# Patient Record
Sex: Female | Born: 2000 | Race: White | Marital: Single | Smoking: Never smoker
Health system: Southern US, Community
[De-identification: ages and names within clinical notes are randomized; demographics above are authoritative.]

## PROBLEM LIST (undated history)

## (undated) DIAGNOSIS — H548 Legal blindness, as defined in USA: Secondary | ICD-10-CM

## (undated) DIAGNOSIS — G40909 Epilepsy, unspecified, not intractable, without status epilepticus: Secondary | ICD-10-CM

## (undated) DIAGNOSIS — F840 Autistic disorder: Secondary | ICD-10-CM

## (undated) DIAGNOSIS — R413 Other amnesia: Secondary | ICD-10-CM

## (undated) DIAGNOSIS — F819 Developmental disorder of scholastic skills, unspecified: Secondary | ICD-10-CM

## (undated) DIAGNOSIS — R87629 Unspecified abnormal cytological findings in specimens from vagina: Secondary | ICD-10-CM

## (undated) DIAGNOSIS — F88 Other disorders of psychological development: Secondary | ICD-10-CM

## (undated) DIAGNOSIS — F32A Depression, unspecified: Secondary | ICD-10-CM

## (undated) DIAGNOSIS — F909 Attention-deficit hyperactivity disorder, unspecified type: Secondary | ICD-10-CM

## (undated) DIAGNOSIS — G939 Disorder of brain, unspecified: Secondary | ICD-10-CM

## (undated) DIAGNOSIS — G43909 Migraine, unspecified, not intractable, without status migrainosus: Secondary | ICD-10-CM

## (undated) DIAGNOSIS — F329 Major depressive disorder, single episode, unspecified: Secondary | ICD-10-CM

## (undated) DIAGNOSIS — H479 Unspecified disorder of visual pathways: Secondary | ICD-10-CM

## (undated) HISTORY — DX: Disorder of brain, unspecified: G93.9

## (undated) HISTORY — DX: Developmental disorder of scholastic skills, unspecified: F81.9

## (undated) HISTORY — DX: Autistic disorder: F84.0

## (undated) HISTORY — DX: Depression, unspecified: F32.A

## (undated) HISTORY — DX: Migraine, unspecified, not intractable, without status migrainosus: G43.909

## (undated) HISTORY — DX: Unspecified disorder of visual pathways: H47.9

## (undated) HISTORY — DX: Attention-deficit hyperactivity disorder, unspecified type: F90.9

## (undated) HISTORY — PX: WISDOM TOOTH EXTRACTION: SHX21

## (undated) HISTORY — DX: Epilepsy, unspecified, not intractable, without status epilepticus: G40.909

## (undated) HISTORY — DX: Other disorders of psychological development: F88

## (undated) HISTORY — DX: Unspecified abnormal cytological findings in specimens from vagina: R87.629

## (undated) HISTORY — DX: Legal blindness, as defined in USA: H54.8

## (undated) HISTORY — PX: MOUTH SURGERY: SHX715

## (undated) HISTORY — DX: Other amnesia: R41.3

---
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
## 2020-01-09 ENCOUNTER — Other Ambulatory Visit: Payer: Self-pay

## 2020-01-09 ENCOUNTER — Ambulatory Visit (INDEPENDENT_AMBULATORY_CARE_PROVIDER_SITE_OTHER): Payer: BC Managed Care – PPO | Admitting: Family Medicine

## 2020-01-09 ENCOUNTER — Encounter: Payer: Self-pay | Admitting: Family Medicine

## 2020-01-09 VITALS — BP 126/79 | HR 97 | Temp 99.3°F | Ht 65.0 in | Wt 190.0 lb

## 2020-01-09 DIAGNOSIS — G939 Disorder of brain, unspecified: Secondary | ICD-10-CM | POA: Diagnosis not present

## 2020-01-09 DIAGNOSIS — Z30011 Encounter for initial prescription of contraceptive pills: Secondary | ICD-10-CM

## 2020-01-09 DIAGNOSIS — F819 Developmental disorder of scholastic skills, unspecified: Secondary | ICD-10-CM

## 2020-01-09 DIAGNOSIS — G43909 Migraine, unspecified, not intractable, without status migrainosus: Secondary | ICD-10-CM

## 2020-01-09 DIAGNOSIS — K219 Gastro-esophageal reflux disease without esophagitis: Secondary | ICD-10-CM

## 2020-01-09 DIAGNOSIS — G40909 Epilepsy, unspecified, not intractable, without status epilepticus: Secondary | ICD-10-CM | POA: Diagnosis not present

## 2020-01-09 DIAGNOSIS — F88 Other disorders of psychological development: Secondary | ICD-10-CM

## 2020-01-09 DIAGNOSIS — H479 Unspecified disorder of visual pathways: Secondary | ICD-10-CM

## 2020-01-09 DIAGNOSIS — R413 Other amnesia: Secondary | ICD-10-CM

## 2020-01-09 LAB — PREGNANCY, URINE: Preg Test, Ur: NEGATIVE

## 2020-01-09 MED ORDER — NORETHINDRONE ACET-ETHINYL EST 1-20 MG-MCG PO TABS: ORAL_TABLET | ORAL | 3 refills | Status: DC

## 2020-01-09 MED ORDER — CETIRIZINE HCL 10 MG PO TABS: 10.0000 mg | ORAL_TABLET | Freq: Every day | ORAL | 1 refills | Status: DC

## 2020-01-09 MED ORDER — FAMOTIDINE 20 MG PO TABS: 20.0000 mg | ORAL_TABLET | Freq: Two times a day (BID) | ORAL | 2 refills | Status: DC

## 2020-01-09 NOTE — Progress Notes (Signed)
New Patient Office Visit  Assessment & Plan:  1. Encounter for prescription of oral contraceptives - Pregnancy, urine (negative) - norethindrone-ethinyl estradiol (LOESTRIN 1/20, 21,) 1-20 MG-MCG tablet; Take 1 tablet by mouth daily x3 weeks, then stop x1 week, then start new pack.  Dispense: 3 Package; Refill: 3  2. Gastroesophageal reflux disease, unspecified whether esophagitis present - Uncontrolled. New prescription. Education provided on food choices for GERD.  - famotidine (PEPCID) 20 MG tablet; Take 1 tablet (20 mg total) by mouth 2 (two) times daily.  Dispense: 60 tablet; Refill: 2  3-8. Brain damage/Nonintractable epilepsy without status epilepticus, unspecified epilepsy type (HCC)/Learning disabilities/Migraine without status migrainosus, not intractable, unspecified migraine type/Sensory processing difficulty/Short-term memory loss - Ambulatory referral to Neurology  9. Cortical visual impairment - Handicap placard form completed.     Follow-up: Return for annual physical.   Claire Boston, MSN, APRN, FNP-C [facility]  Subjective:  Patient ID: Claire Stewart. [place], female    DOB: 2001-03-12  Age: 19 y.o. MRN: [id number]  Patient Care Team: Gwenlyn Fudge, FNP as PCP - General (Family Medicine)  CC:  Chief Complaint  Patient presents with  . New Patient (Initial Visit)    [name]   . Establish Care  . Abdominal Pain    Patient states that it has been going on since August   . Referral    Neuro    HPI Claire Stewart presents to establish care. She is transferring from the [facility] and [facility].   Patient is accompanied by her mother.  Patient is in need of a referral to a neurologist in the area.  She prefers a female and would like to go to [place].  She has been to [facility] in the past but that was when she was only 19 year of age and does not wish to go all the way to [place] if she does  not have to.  Patient is in need of a refill of her birth control that she takes for hormone control due to seizures.  She has been out of her medication for almost 2 weeks now.  Patient reports her stomach has been hurting for the past 8 months.  She states it is worse when she is lying flat and at night.  Mom reports she "eats TUMS like candy".  Patient did try Bentyl in the past but it was not effective.  She has regular bowel movements 2-3 times a day with no pain or straining.   Review of Systems  Constitutional: Negative for chills, fever, malaise/fatigue and weight loss.  HENT: Positive for ear pain. Negative for congestion, ear discharge, nosebleeds, sinus pain, sore throat and tinnitus.   Eyes: Positive for blurred vision. Negative for double vision, pain, discharge and redness.  Respiratory: Negative for cough, shortness of breath and wheezing.   Cardiovascular: Negative for chest pain, palpitations and leg swelling.  Gastrointestinal: Positive for abdominal pain and heartburn. Negative for constipation, diarrhea, nausea and vomiting.  Genitourinary: Negative for dysuria, frequency and urgency.  Musculoskeletal: Negative for myalgias.  Skin: Negative for rash.  Neurological: Positive for seizures and headaches. Negative for dizziness and weakness.  Psychiatric/Behavioral: Positive for memory loss. Negative for depression, substance abuse and suicidal ideas. The patient is not nervous/anxious.     Current Outpatient Medications:  .  cetirizine (ZYRTEC) 10 MG tablet, Take 1 tablet (10 mg total) by mouth daily., Disp: 90 tablet, Rfl: 1 .  Levetiracetam 750  MG TB24, Take 3,000 mg by mouth daily. Take 4 tablets at night, Disp: , Rfl:  .  magnesium gluconate (MAGONATE) 500 MG tablet, Take 500 mg by mouth daily., Disp: , Rfl:  .  meloxicam (MOBIC) 7.5 MG tablet, Take 7.5 mg by mouth as needed for pain., Disp: , Rfl:  .  OVER THE COUNTER MEDICATION, Gummy multi vit, Disp: , Rfl:  .  OVER  THE COUNTER MEDICATION, Gummy digestive advantage, Disp: , Rfl:  .  PRESCRIPTION MEDICATION, Dialstat, Disp: , Rfl:  .  prochlorperazine (COMPAZINE) 10 MG tablet, Take 10 mg by mouth every 6 (six) hours as needed for nausea or vomiting., Disp: , Rfl:  .  SUMAtriptan (IMITREX) 20 MG/ACT nasal spray, Place 20 mg into the nose every 2 (two) hours as needed for migraine or headache. May repeat in 2 hours if headache persists or recurs., Disp: , Rfl:  .  topiramate (TOPAMAX) 50 MG tablet, Take 150 mg by mouth daily. 50mg  AM- 100mg  PM, Disp: , Rfl:  .  famotidine (PEPCID) 20 MG tablet, Take 1 tablet (20 mg total) by mouth 2 (two) times daily., Disp: 60 tablet, Rfl: 2 .  norethindrone-ethinyl estradiol (LOESTRIN 1/20, 21,) 1-20 MG-MCG tablet, Take 1 tablet by mouth daily x3 weeks, then stop x1 week, then start new pack., Disp: 3 Package, Rfl: 3  Allergies  Allergen Reactions  . Sulfa Antibiotics Hives  . Levetiracetam Other (See Comments)    Can not have brand name- - caused nerve pain     Past Medical History:  Diagnosis Date  . ADHD   . Brain damage   . Cortical visual impairment   . Depression   . Epilepsia (HCC)   . Migraines   . Short-term memory loss     Past Surgical History:  Procedure Laterality Date  . WISDOM TOOTH EXTRACTION      Family History  Problem Relation Age of Onset  . Hyperlipidemia Mother   . Allergies Mother   . ADD / ADHD Mother   . Depression Mother   . Arthritis Mother   . Alcohol abuse Father   . Drug abuse Father   . Depression Father   . Allergies Brother   . ADD / ADHD Brother   . Drug abuse Maternal Grandmother   . Diabetes Maternal Grandmother   . Bipolar disorder Maternal Grandmother   . Hypertension Maternal Grandfather   . Hyperlipidemia Maternal Grandfather   . Depression Maternal Grandfather   . Arthritis Maternal Grandfather     Social History   Socioeconomic History  . Marital status: Single    Spouse name: Not on file   . Number of children: Not on file  . Years of education: Not on file  . Highest education level: Not on file  Occupational History  . Not on file  Tobacco Use  . Smoking status: Never Smoker  . Smokeless tobacco: Never Used  Substance and Sexual Activity  . Alcohol use: Never  . Drug use: Never  . Sexual activity: Not Currently  Other Topics Concern  . Not on file  Social History Narrative  . Not on file   Social Determinants of Health   Financial Resource Strain:   . Difficulty of Paying Living Expenses:   Food Insecurity:   . Worried About 2/20 in the Last Year:   . [employer] in the Last Year:   Transportation Needs:   . Programme researcher, broadcasting/film/video (Medical):   Barista  Lack of Transportation (Non-Medical):   Physical Activity:   . Days of Exercise per Week:   . Minutes of Exercise per Session:   Stress:   . Feeling of Stress :   Social Connections:   . Frequency of Communication with Friends and Family:   . Frequency of Social Gatherings with Friends and Family:   . Attends Religious Services:   . Active Member of Clubs or Organizations:   . Attends Archivist Meetings:   Marland Kitchen Marital Status:   Intimate Partner Violence:   . Fear of Current or Ex-Partner:   . Emotionally Abused:   Marland Kitchen Physically Abused:   . Sexually Abused:     Objective:   Today's Vitals: BP 126/79   Pulse 97   Temp 99.3 F (37.4 C) (Temporal)   Ht 5\' 5"  (1.651 m)   Wt 190 lb (86.2 kg)   LMP 12/24/2019 (Approximate)   SpO2 96%   BMI 31.62 kg/m   Physical Exam Vitals reviewed.  Constitutional:      General: She is not in acute distress.    Appearance: Normal appearance. She is obese. She is not ill-appearing, toxic-appearing or diaphoretic.  HENT:     Head: Normocephalic and atraumatic.     Right Ear: Tympanic membrane, ear canal and external ear normal. There is no impacted cerumen.     Left Ear: Tympanic membrane, ear canal and external ear normal. There is no  impacted cerumen.  Eyes:     General: No scleral icterus.       Right eye: No discharge.        Left eye: No discharge.     Conjunctiva/sclera: Conjunctivae normal.  Cardiovascular:     Rate and Rhythm: Normal rate and regular rhythm.     Heart sounds: Normal heart sounds. No murmur. No friction rub. No gallop.   Pulmonary:     Effort: Pulmonary effort is normal. No respiratory distress.     Breath sounds: Normal breath sounds. No stridor. No wheezing, rhonchi or rales.  Musculoskeletal:        General: Normal range of motion.     Cervical back: Normal range of motion.  Skin:    General: Skin is warm and dry.     Capillary Refill: Capillary refill takes less than 2 seconds.  Neurological:     General: No focal deficit present.     Mental Status: She is alert and oriented to person, place, and time. Mental status is at baseline.  Psychiatric:        Mood and Affect: Mood normal.        Behavior: Behavior normal.        Thought Content: Thought content normal.        Judgment: Judgment normal.

## 2020-01-09 NOTE — Patient Instructions (Signed)

## 2020-01-13 ENCOUNTER — Encounter: Payer: Self-pay | Admitting: Family Medicine

## 2020-01-13 DIAGNOSIS — R413 Other amnesia: Secondary | ICD-10-CM | POA: Insufficient documentation

## 2020-01-13 DIAGNOSIS — F88 Other disorders of psychological development: Secondary | ICD-10-CM | POA: Insufficient documentation

## 2020-01-13 DIAGNOSIS — H479 Unspecified disorder of visual pathways: Secondary | ICD-10-CM | POA: Insufficient documentation

## 2020-01-13 DIAGNOSIS — G43909 Migraine, unspecified, not intractable, without status migrainosus: Secondary | ICD-10-CM | POA: Insufficient documentation

## 2020-01-13 DIAGNOSIS — G40909 Epilepsy, unspecified, not intractable, without status epilepticus: Secondary | ICD-10-CM | POA: Insufficient documentation

## 2020-01-13 DIAGNOSIS — G939 Disorder of brain, unspecified: Secondary | ICD-10-CM | POA: Insufficient documentation

## 2020-01-13 DIAGNOSIS — F819 Developmental disorder of scholastic skills, unspecified: Secondary | ICD-10-CM | POA: Insufficient documentation

## 2020-02-05 ENCOUNTER — Encounter: Payer: Self-pay | Admitting: Family Medicine

## 2020-02-11 ENCOUNTER — Other Ambulatory Visit: Payer: Self-pay | Admitting: Family Medicine

## 2020-02-27 ENCOUNTER — Other Ambulatory Visit: Payer: Self-pay

## 2020-02-27 ENCOUNTER — Encounter: Payer: Self-pay | Admitting: Neurology

## 2020-02-27 ENCOUNTER — Ambulatory Visit (INDEPENDENT_AMBULATORY_CARE_PROVIDER_SITE_OTHER): Payer: BC Managed Care – PPO | Admitting: Neurology

## 2020-02-27 VITALS — BP 125/78 | HR 88 | Ht 65.0 in | Wt 190.0 lb

## 2020-02-27 DIAGNOSIS — G40301 Generalized idiopathic epilepsy and epileptic syndromes, not intractable, with status epilepticus: Secondary | ICD-10-CM

## 2020-02-27 DIAGNOSIS — IMO0002 Reserved for concepts with insufficient information to code with codable children: Secondary | ICD-10-CM | POA: Insufficient documentation

## 2020-02-27 DIAGNOSIS — F809 Developmental disorder of speech and language, unspecified: Secondary | ICD-10-CM | POA: Insufficient documentation

## 2020-02-27 DIAGNOSIS — R6250 Unspecified lack of expected normal physiological development in childhood: Secondary | ICD-10-CM

## 2020-02-27 DIAGNOSIS — G43709 Chronic migraine without aura, not intractable, without status migrainosus: Secondary | ICD-10-CM | POA: Diagnosis not present

## 2020-02-27 MED ORDER — LEVETIRACETAM ER 750 MG PO TB24: 3000.0000 mg | ORAL_TABLET | Freq: Every day | ORAL | 4 refills | Status: DC

## 2020-02-27 MED ORDER — RIZATRIPTAN BENZOATE 10 MG PO TBDP
10.0000 mg | ORAL_TABLET | ORAL | 6 refills | Status: DC | PRN
Start: 2020-02-27 — End: 2020-08-31

## 2020-02-27 MED ORDER — TOPIRAMATE 100 MG PO TABS: 100.0000 mg | ORAL_TABLET | Freq: Two times a day (BID) | ORAL | 4 refills | Status: DC

## 2020-02-27 NOTE — Progress Notes (Addendum)
PATIENT: Claire Stewart DOB: [birth year]  Chief Complaint  Patient presents with  . Migraine    She moved here from to [place] in September 2020. She is here with her mother, [name]. She estimates having 1-3 migraines each month. Weather changes play a big role in her headaches. She had Mobic, Compazine and Imitrex to take in combination for her severe headaches. Her mother also states she has a six day steroid dose pack on file at the pharmacy to take if the home meds failed.   . Seizures    Reports her last seizure occurred on 05/12/2015.  Claire Stewart Learning Disabilities    No new changes in memory. Reports short term memory issues and learning disabilites since childhood.   Claire Stewart PCP    Gwenlyn Fudge, FNP     HISTORICAL  Claire Stewart is a 19 year old female, seen in request by her primary care nurse practitioner [name] evaluation of seizure, learning disability, migraine headache, she is accompanied by her mother at today's clinical visit on February 27, 2020  I reviewed and summarized the referring note. She moved from [place] to [place] recently, was previously under the care of neurologist for seizure, migraine headaches  She was born full-term, but suffered jaundice, at 57 days old, she suffered prolonged seizure, later noted developmental delay, was found to have significant visual impairment, she has been homeschooled,  She was treated with antiepileptic medication as an infant following her initial prolonged seizure, but medicine was tapered off,  By 19 years old, she was noted to have frequent staring off spells, eventually was diagnosed with partial seizure, has been treated with Keppra titrating dose, currently taking extended release 750 mg 4 tablets at nighttime, works well for her, last seizure was on May 12, 2015, she had a sudden onset of staring spells,  She has a long history of migraine headaches, now having 1-3 times each month, taking Topamax 50/100  mg as preventive medication, Imitrex works most of the time, but sometimes her headache is so severe, with significant light noise sensitivity, Imitrex would not work for those prolonged severe migraine headaches  She is legally blind,can read large print only,  REVIEW OF SYSTEMS: Full 14 system review of systems performed and notable only for as above All other review of systems were negative.  ALLERGIES: Allergies  Allergen Reactions  . Sulfa Antibiotics Hives  . Levetiracetam Other (See Comments)    Can not have brand name- [employer]- caused nerve pain     HOME MEDICATIONS: Current Outpatient Medications  Medication Sig Dispense Refill  . cetirizine (ZYRTEC) 10 MG tablet Take 1 tablet (10 mg total) by mouth daily. 90 tablet 1  . famotidine (PEPCID) 20 MG tablet Take 1 tablet (20 mg total) by mouth 2 (two) times daily. 60 tablet 2  . Levetiracetam 750 MG TB24 Take 3,000 mg by mouth daily. Take 4 tablets at night    . magnesium gluconate (MAGONATE) 500 MG tablet Take 500 mg by mouth daily.    . meloxicam (MOBIC) 7.5 MG tablet TAKE 1 TABLET (7.5 MG TOTAL) BY MOUTH AS NEEDED FOR MODERATE PAIN (LIMIT ONCE DAILY TWICE WEEKLY.). 90 tablet 1  . norethindrone-ethinyl estradiol (LOESTRIN 1/20, 21,) 1-20 MG-MCG tablet Take 1 tablet by mouth daily x3 weeks, then stop x1 week, then start new pack. 3 Package 3  . OVER THE COUNTER MEDICATION Gummy multi vit    . OVER THE COUNTER MEDICATION Gummy digestive advantage    .  PRESCRIPTION MEDICATION Dialstat    . prochlorperazine (COMPAZINE) 10 MG tablet TAKE 1 TABLET BY MOUTH 2 TIMES A DAY AS NEEDED FOR NAUSEA. 30 tablet 2  . SUMAtriptan (IMITREX) 20 MG/ACT nasal spray Place 20 mg into the nose every 2 (two) hours as needed for migraine or headache. May repeat in 2 hours if headache persists or recurs.    . topiramate (TOPAMAX) 50 MG tablet Take 150 mg by mouth daily. 50mg  AM- 100mg  PM     No current facility-administered medications for this visit.     PAST MEDICAL HISTORY: Past Medical History:  Diagnosis Date  . ADHD   . Autism    mildly  . 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 Cortical visual impairment   . Depression   . Epilepsy ([place])   . Learning disabilities    global language learning disability  . Legally blind   . Migraines   . Sensory processing difficulty   . Short-term memory loss     PAST SURGICAL HISTORY: Past Surgical History:  Procedure Laterality Date  . MOUTH SURGERY    . WISDOM TOOTH EXTRACTION      FAMILY HISTORY: Family History  Problem Relation Age of Onset  . Hyperlipidemia Mother   . Allergies Mother   . ADD / ADHD Mother   . Depression Mother   . Arthritis Mother   . Alcohol abuse Father   . Drug abuse Father   . Depression Father   . Thyroid disease Father   . Allergies Brother   . ADD / ADHD Brother   . Drug abuse Maternal Grandmother   . Diabetes Maternal Grandmother   . Bipolar disorder Maternal Grandmother   . Hypertension Maternal Grandfather   . Hyperlipidemia Maternal Grandfather   . Depression Maternal Grandfather   . Arthritis Maternal Grandfather     SOCIAL HISTORY: Social History   Socioeconomic History  . Marital status: Single    Spouse name: Not on file  . Number of children: 0  . Years of education: high school student - home schooled  . Highest education level: Not on file  Occupational History  . Occupation: Ship broker  Tobacco Use  . Smoking status: Never Smoker  . Smokeless tobacco: Never Used  Vaping Use  . Vaping Use: Never used  Substance and Sexual Activity  . Alcohol use: Never  . Drug use: Never  . Sexual activity: Never  Other Topics Concern  . Not on file  Social History Narrative   Lives with her mother and two younger brothers.   Right-handed.   No daily use of caffeine.   Social Determinants of Health   Financial Resource Strain:   . Difficulty of Paying Living Expenses:   Food Insecurity:   . Worried  About Charity fundraiser in the Last Year:   . Arboriculturist in the Last Year:   Transportation Needs:   . Film/video editor (Medical):   Claire Stewart Lack of Transportation (Non-Medical):   Physical Activity:   . Days of Exercise per Week:   . Minutes of Exercise per Session:   Stress:   . Feeling of Stress :   Social Connections:   . Frequency of Communication with Friends and Family:   . Frequency of Social Gatherings with Friends and Family:   . Attends Religious Services:   . Active Member of Clubs or Organizations:   . Attends Archivist  Meetings:   Claire Stewart Marital Status:   Intimate Partner Violence:   . Fear of Current or Ex-Partner:   . Emotionally Abused:   Claire Stewart Physically Abused:   . Sexually Abused:      PHYSICAL EXAM   Vitals:   02/27/20 0757  BP: 125/78  Pulse: 88  Weight: 190 lb (86.2 kg)  Height: 5\' 5"  (1.651 m)   Not recorded     Body mass index is 31.62 kg/m.  PHYSICAL EXAMNIATION:  Gen: NAD, conversant, well nourised, well groomed                     Cardiovascular: Regular rate rhythm, no peripheral edema, warm, nontender. Eyes: Conjunctivae clear without exudates or hemorrhage Neck: Supple, no carotid bruits. Pulmonary: Clear to auscultation bilaterally   NEUROLOGICAL EXAM:  MENTAL STATUS: Speech/cognition: Awake, alert, follow commands, but difficulty to comprehend medication instruction, rely on her mother to supplement medical history   CRANIAL NERVES: CN II:   Pupils are round equal and briskly reactive to light.  Decreased peripheral visual field, counting read large print, need close up to read medium-size print CN III, IV, VI: extraocular movement are normal. No ptosis. CN V: Facial sensation is intact to light touch CN VII: Face is symmetric with normal eye closure  CN VIII: Hearing is normal to causal conversation. CN IX, X: Phonation is normal. CN XI: Head turning and shoulder shrug are intact  MOTOR: There is no pronator  drift of out-stretched arms. Muscle bulk and tone are normal. Muscle strength is normal.  REFLEXES: Reflexes are 2+ and symmetric at the biceps, triceps, knees, and ankles. Plantar responses are flexor.  SENSORY: Intact to light touch, pinprick and vibratory sensation are intact in fingers and toes.  COORDINATION: There is no trunk or limb dysmetria noted.  GAIT/STANCE: Needs push-up to get up from seated position, wide-based, cautious   DIAGNOSTIC DATA (LABS, IMAGING, TESTING) - I reviewed patient records, labs, notes, testing and imaging myself where available.   ASSESSMENT AND PLAN  Ertha D. Giraldo is a 19 y.o. 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 MR BRAIN W WO CONTRAST  . TSH  . CBC With Differential  . Comprehensive metabolic panel  . EEG    May 14, 2015, M.D. Ph.D.  [facility] [address], [address] [place], [place] Ph: [phone] Fax: [phone]  CC: [phone], FNP  Referring Physician

## 2020-02-28 LAB — CBC WITH DIFFERENTIAL
Basophils Absolute: 0.1 10*3/uL (ref 0.0–0.2)
Basos: 1 %
EOS (ABSOLUTE): 0.1 10*3/uL (ref 0.0–0.4)
Eos: 2 %
Hematocrit: 44.3 % (ref 34.0–46.6)
Hemoglobin: 15.2 g/dL (ref 11.1–15.9)
Immature Grans (Abs): 0 10*3/uL (ref 0.0–0.1)
Immature Granulocytes: 1 %
Lymphocytes Absolute: 1.7 10*3/uL (ref 0.7–3.1)
Lymphs: 28 %
MCH: 31.5 pg (ref 26.6–33.0)
MCHC: 34.3 g/dL (ref 31.5–35.7)
MCV: 92 fL (ref 79–97)
Monocytes Absolute: 0.5 10*3/uL (ref 0.1–0.9)
Monocytes: 8 %
Neutrophils Absolute: 3.6 10*3/uL (ref 1.4–7.0)
Neutrophils: 60 %
RBC: 4.83 x10E6/uL (ref 3.77–5.28)
RDW: 11.3 % — ABNORMAL LOW (ref 11.7–15.4)
WBC: 6.1 10*3/uL (ref 3.4–10.8)

## 2020-02-28 LAB — COMPREHENSIVE METABOLIC PANEL
ALT: 11 IU/L (ref 0–32)
AST: 14 IU/L (ref 0–40)
Albumin/Globulin Ratio: 1.3 (ref 1.2–2.2)
Albumin: 4.3 g/dL (ref 3.9–5.0)
Alkaline Phosphatase: 99 IU/L (ref 45–106)
BUN/Creatinine Ratio: 10 (ref 9–23)
BUN: 8 mg/dL (ref 6–20)
Bilirubin Total: 0.3 mg/dL (ref 0.0–1.2)
CO2: 19 mmol/L — ABNORMAL LOW (ref 20–29)
Calcium: 9.6 mg/dL (ref 8.7–10.2)
Chloride: 107 mmol/L — ABNORMAL HIGH (ref 96–106)
Creatinine, Ser: 0.82 mg/dL (ref 0.57–1.00)
GFR calc Af Amer: 120 mL/min/{1.73_m2} (ref >59)
GFR calc non Af Amer: 104 mL/min/{1.73_m2} (ref >59)
Globulin, Total: 3.2 g/dL (ref 1.5–4.5)
Glucose: 93 mg/dL (ref 65–99)
Potassium: 4.5 mmol/L (ref 3.5–5.2)
Sodium: 141 mmol/L (ref 134–144)
Total Protein: 7.5 g/dL (ref 6.0–8.5)

## 2020-02-28 LAB — TSH: TSH: 3.73 u[IU]/mL (ref 0.450–4.500)

## 2020-03-02 ENCOUNTER — Telehealth: Payer: Self-pay | Admitting: *Deleted

## 2020-03-02 NOTE — Telephone Encounter (Signed)
-----   Message from Levert Feinstein, MD sent at 03/02/2020  8:15 AM EDT ----- Please call patient for no significant abnormality on laboratory evaluations

## 2020-03-02 NOTE — Telephone Encounter (Signed)
I called pt, no answer. Left a detailed message per DPR advising her of lab work results and asking him to call us back with questions or concerns.

## 2020-03-03 ENCOUNTER — Telehealth: Payer: Self-pay | Admitting: Neurology

## 2020-03-03 NOTE — Telephone Encounter (Signed)
BCBS Auth: [id number] (exp. 03/03/20 to 04/01/20)/medicaid order sent to [facility]. They will obtain the auth for medicaid and reach out to the patient to schedule.

## 2020-03-06 ENCOUNTER — Other Ambulatory Visit: Payer: BC Managed Care – PPO

## 2020-03-14 ENCOUNTER — Other Ambulatory Visit: Payer: BC Managed Care – PPO

## 2020-03-30 ENCOUNTER — Ambulatory Visit (INDEPENDENT_AMBULATORY_CARE_PROVIDER_SITE_OTHER): Payer: BC Managed Care – PPO | Admitting: Neurology

## 2020-03-30 DIAGNOSIS — G40301 Generalized idiopathic epilepsy and epileptic syndromes, not intractable, with status epilepticus: Secondary | ICD-10-CM

## 2020-03-30 DIAGNOSIS — IMO0002 Reserved for concepts with insufficient information to code with codable children: Secondary | ICD-10-CM

## 2020-03-30 DIAGNOSIS — R6250 Unspecified lack of expected normal physiological development in childhood: Secondary | ICD-10-CM

## 2020-04-11 ENCOUNTER — Ambulatory Visit
Admission: RE | Admit: 2020-04-11 | Discharge: 2020-04-11 | Disposition: A | Discharge status: Home / Self Care | Payer: BC Managed Care – PPO | Source: Ambulatory Visit | Attending: Neurology | Admitting: Neurology

## 2020-04-11 DIAGNOSIS — R6250 Unspecified lack of expected normal physiological development in childhood: Secondary | ICD-10-CM

## 2020-04-11 DIAGNOSIS — IMO0002 Reserved for concepts with insufficient information to code with codable children: Secondary | ICD-10-CM

## 2020-04-11 DIAGNOSIS — G40301 Generalized idiopathic epilepsy and epileptic syndromes, not intractable, with status epilepticus: Secondary | ICD-10-CM

## 2020-04-11 MED ORDER — GADOBENATE DIMEGLUMINE 529 MG/ML IV SOLN
18.0000 mL | Freq: Once | INTRAVENOUS | Status: AC | PRN
  Administered 2020-04-11: 18 mL via INTRAVENOUS

## 2020-04-13 ENCOUNTER — Telehealth: Payer: Self-pay | Admitting: Neurology

## 2020-04-13 NOTE — Telephone Encounter (Signed)
IMPRESSION: Abnormal MRI scan of the brain with and without contrast showing bilateral occipital and parieto-occipital areas of cystic encephalomalacia and gliosis.  There are incidental changes of chronic paranasal sinusitis , borderline low cerebellar tonsils and hypoplasia of posterior circulation vessels.  Please call patient, MRI of the brain showed chronic bilateral occipital, parietal occipital area scar, there was no acute abnormalities,

## 2020-04-13 NOTE — Telephone Encounter (Signed)
I spoke to the patient's mother on DPR. She verbalized understanding of the MRI findings.   She would also like to get the patient's EEG results.

## 2020-04-13 NOTE — Telephone Encounter (Signed)
Left message requesting a call back.

## 2020-04-15 NOTE — Procedures (Signed)
   HISTORY: 19 years old female, reported history of migraine and seizure.  TECHNIQUE:  This is a routine 16 channel EEG recording with one channel devoted to a limited EKG recording.  It was performed during wakefulness, drowsiness and asleep.  Hyperventilation and photic stimulation were performed as activating procedures.  There are frequent frontal muscle artifact, and eye blinking artifact noted.  Upon maximum arousal, posterior dominant waking rhythm consistent of mildly dysrhythmic alpha range activity, with frequency of 8 hz. Activities are symmetric over the bilateral posterior derivations and attenuated with eye opening.  Hyperventilation produced mild/moderate buildup with higher amplitude and the slower activities noted.  Photic stimulation did not alter the tracing.  During EEG recording, patient developed drowsiness and no deeper stage of sleep was achieved During EEG recording, there was no epileptiform discharge noted.  EKG demonstrate sinus rhythm, with heart rate of 96 bpm  CONCLUSION: This is a  normal awake EEG.  There is no electrodiagnostic evidence of epileptiform discharge.  Levert Feinstein, M.D. Ph.D.  [facility] [address] [place], [place] [address] Phone: [phone] Fax:      [phone]

## 2020-04-15 NOTE — Telephone Encounter (Signed)
I have My chart Message her normal EEG

## 2020-05-11 ENCOUNTER — Other Ambulatory Visit: Payer: Self-pay | Admitting: Family Medicine

## 2020-05-11 DIAGNOSIS — K219 Gastro-esophageal reflux disease without esophagitis: Secondary | ICD-10-CM

## 2020-06-25 ENCOUNTER — Encounter: Payer: Self-pay | Admitting: Family Medicine

## 2020-06-25 ENCOUNTER — Other Ambulatory Visit: Payer: Self-pay

## 2020-06-25 ENCOUNTER — Ambulatory Visit (INDEPENDENT_AMBULATORY_CARE_PROVIDER_SITE_OTHER): Payer: Medicaid Other | Admitting: Family Medicine

## 2020-06-25 VITALS — BP 120/78 | HR 85 | Temp 97.8°F | Ht 65.0 in | Wt 181.6 lb

## 2020-06-25 DIAGNOSIS — R102 Pelvic and perineal pain: Secondary | ICD-10-CM

## 2020-06-25 DIAGNOSIS — M79605 Pain in left leg: Secondary | ICD-10-CM

## 2020-06-25 DIAGNOSIS — M79604 Pain in right leg: Secondary | ICD-10-CM

## 2020-06-25 NOTE — Progress Notes (Signed)
Assessment & Plan:  1. Suprapubic pain - US Pelvic Complete With Transvaginal; Future  2. Pain in both lower extremities - NSAIDs, muscle rub, and stretches. Exercises provided for quadriceps.    Follow up plan: Return if symptoms worsen or fail to improve.  [name], MSN, APRN, FNP-C [facility]  Subjective:   Patient ID: Claire Stewart. Fulp, female    DOB: 2001/05/13, 19 y.o.   MRN: [id number]  HPI: Glendia Stewart. Rocca is a 19 y.o. female presenting on 06/25/2020 for Abdominal Pain (Patient states that she has been having lower abd pain x 2 years daily.) and Leg Pain (Patient has been having bilateral leg weakness for a few weeks.)  Patient is accompanied by her mom.   Patient reports lower abdominal pain that is constant every day. Initially they felt it had been going on x2 years but they believe the start of that pain 2 years ago was heartburn and that this lower abdominal pain has only been present the past 6 months. It is worse when she is on her period and at night. She describes the pain as pressure and rates it 8-9/10 when she is on her period and 2-4/10 when she isn't. Ginger tea and a heating pad are somewhat helpful. Ibuprofen doesn't help at all.   Patient also reports pain from her hips to her knees along the muscles of her thighs that occurs when she is walking/standing. This has been going on for a couple of weeks now. The pain makes her feel weak and like her legs might give out sometimes. No known injury. No current exercise.    ROS: Negative unless specifically indicated above in HPI.   Relevant past medical history reviewed and updated as indicated.   Allergies and medications reviewed and updated.   Current Outpatient Medications:  .  cetirizine (ZYRTEC) 10 MG tablet, Take 1 tablet (10 mg total) by mouth daily., Disp: 90 tablet, Rfl: 1 .  famotidine (PEPCID) 20 MG tablet, TAKE 1 TABLET BY MOUTH TWICE A DAY, Disp: 180 tablet, Rfl: 0 .   Levetiracetam 750 MG TB24, Take 4 tablets (3,000 mg total) by mouth daily. Take 4 tablets at night, Disp: 360 tablet, Rfl: 4 .  magnesium gluconate (MAGONATE) 500 MG tablet, Take 500 mg by mouth daily., Disp: , Rfl:  .  meloxicam (MOBIC) 7.5 MG tablet, TAKE 1 TABLET (7.5 MG TOTAL) BY MOUTH AS NEEDED FOR MODERATE PAIN (LIMIT ONCE DAILY TWICE WEEKLY.)., Disp: 90 tablet, Rfl: 1 .  norethindrone-ethinyl estradiol (LOESTRIN 1/20, 21,) 1-20 MG-MCG tablet, Take 1 tablet by mouth daily x3 weeks, then stop x1 week, then start new pack., Disp: 3 Package, Rfl: 3 .  OVER THE COUNTER MEDICATION, Gummy multi vit, Disp: , Rfl:  .  OVER THE COUNTER MEDICATION, Gummy digestive advantage, Disp: , Rfl:  .  PRESCRIPTION MEDICATION, Dialstat, Disp: , Rfl:  .  prochlorperazine (COMPAZINE) 10 MG tablet, TAKE 1 TABLET BY MOUTH 2 TIMES A DAY AS NEEDED FOR NAUSEA., Disp: 30 tablet, Rfl: 2 .  rizatriptan (MAXALT-MLT) 10 MG disintegrating tablet, Take 1 tablet (10 mg total) by mouth as needed. May repeat in 2 hours if needed, Disp: 15 tablet, Rfl: 6 .  topiramate (TOPAMAX) 100 MG tablet, Take 1 tablet (100 mg total) by mouth 2 (two) times daily. 50mg  AM- 100mg  PM (Patient taking differently: Take 100 mg by mouth 2 (two) times daily. ), Disp: 180 tablet, Rfl: 4  Allergies  Allergen Reactions  . Sulfa  Antibiotics Hives  . Levetiracetam Other (See Comments)    Can not have brand name- [employer]- caused nerve pain     Objective:   BP 120/78   Pulse 85   Temp 97.8 F (36.6 C) (Temporal)   Ht 5\' 5"  (1.651 m)   Wt 181 lb 9.6 oz (82.4 kg)   LMP 06/25/2020 (Approximate)   SpO2 99%   BMI 30.22 kg/m    Physical Exam Vitals reviewed.  Constitutional:      General: She is not in acute distress.    Appearance: Normal appearance. She is obese. She is not ill-appearing, toxic-appearing or diaphoretic.  HENT:     Head: Normocephalic and atraumatic.  Eyes:     General: No scleral icterus.       Right eye: No discharge.         Left eye: No discharge.     Conjunctiva/sclera: Conjunctivae normal.  Cardiovascular:     Rate and Rhythm: Normal rate and regular rhythm.     Heart sounds: Normal heart sounds. No murmur heard.  No friction rub. No gallop.   Pulmonary:     Effort: Pulmonary effort is normal. No respiratory distress.     Breath sounds: Normal breath sounds. No stridor. No wheezing, rhonchi or rales.  Abdominal:     General: Abdomen is flat. There is no distension or abdominal bruit. There are no signs of injury.     Palpations: Abdomen is soft. There is no shifting dullness, fluid wave, hepatomegaly, splenomegaly, mass or pulsatile mass.     Tenderness: There is abdominal tenderness in the suprapubic area.     Hernia: No hernia is present.  Musculoskeletal:        General: Normal range of motion.     Cervical back: Normal range of motion.     Lumbar back: Normal.     Right hip: Normal.     Left hip: Normal.     Right upper leg: Tenderness (quadriceps) present. No swelling, edema, deformity, lacerations or bony tenderness.     Left upper leg: Tenderness (quadriceps) present. No swelling, edema, deformity, lacerations or bony tenderness.     Right knee: Normal.     Left knee: Normal.  Skin:    General: Skin is warm and dry.     Capillary Refill: Capillary refill takes less than 2 seconds.  Neurological:     General: No focal deficit present.     Mental Status: She is alert and oriented to person, place, and time. Mental status is at baseline.  Psychiatric:        Mood and Affect: Mood normal.        Behavior: Behavior normal.        Thought Content: Thought content normal.        Judgment: Judgment normal.

## 2020-06-25 NOTE — Patient Instructions (Signed)
Quadriceps Strain Rehab Ask your health care provider which exercises are safe for you. Do exercises exactly as told by your health care provider and adjust them as directed. It is normal to feel mild stretching, pulling, tightness, or discomfort as you do these exercises. Stop right away if you feel sudden pain or your pain gets worse. Do not begin these exercises until told by your health care provider. Stretching and range-of-motion exercises These exercises warm up your muscles and joints and improve the movement and flexibility of your thigh. These exercises can also help to relieve stiffness or swelling. Heel slides  1. Lie on your back with both legs straight. If this causes back discomfort, bend the knee of your healthy leg so your foot is flat on the floor. 2. Slowly slide your left / right heel back toward your buttocks. Stop when you feel a gentle stretch in the front of your knee or thigh (quadriceps). 3. Hold this position for __________ seconds. 4. Slowly slide your left / right heel back to the starting position. Repeat __________ times. Complete this exercise __________ times a day. Quadriceps stretch, prone  1. Lie on your abdomen on a firm surface, such as a bed or padded floor (prone position). 2. Bend your left / right knee and hold your ankle. If you cannot reach your ankle or pant leg, loop a belt around your foot and grab the belt instead. 3. Gently pull your heel toward your buttocks. Your knee should not slide out to the side. You should feel a stretch in the front of your thigh and knee (quadriceps). 4. Hold this position for __________ seconds. Repeat __________ times. Complete this exercise __________ times a day. Strengthening exercises These exercises build strength and endurance in your thigh. Endurance is the ability to use your muscles for a long time, even after your muscles get tired. Straight leg raises, supine This exercise stretches the muscles in front of  your thigh (quadriceps) and the muscles that move your hips (hip flexors). Quality counts! Watch for signs that the quadriceps muscle is working to ensure that you are strengthening the correct muscles and not cheating by using healthier muscles. 1. Lie on your back (supine position) with your left / right leg extended and your other knee bent. 2. Tense the muscles in the front of your left / right thigh. You should see your kneecap slide up or see increased dimpling just above the knee. 3. Tighten these muscles even more and raise your leg 4-6 inches (10-15 cm) off the floor. 4. Hold this position for __________ seconds. 5. Keep the thigh muscles tense as you lower your leg. 6. Relax the muscles slowly and completely after each repetition. Repeat __________ times. Complete this exercise __________ times a day. Leg raises, prone This exercise strengthens the muscles that move the hips (hip extensors). 1. Lie on your abdomen on a bed or a firm surface (prone position). Place a pillow under your hips. 2. Bend your left / right knee so your foot is straight up in the air. 3. Squeeze your buttocks muscles and lift your left / right thigh off the bed. Do not let your back arch. 4. Hold this position for __________ seconds. 5. Slowly return to the starting position. Let your muscles relax completely before doing another repetition. Repeat __________ times. Complete this exercise __________ times a day. Wall sits Follow the directions for form closely. Knee pain can occur if your feet or knees are not placed properly.   1. Lean your back against a smooth wall or door, and walk your feet out 18-24 inches (46-61 cm) from it. 2. Place your feet hip-width apart. 3. Slowly slide down the wall or door until your knees bend __________ degrees. Keep your weight back and over your heels, not over your toes. Keep your thighs straight or pointing slightly outward. 4. Hold this position for __________  seconds. 5. Use your thigh and buttocks muscles to push yourself back up to a standing position. Keep your weight through your heels while you do this. 6. Rest for __________ seconds after each repetition. Repeat __________ times. Complete this exercise __________ times a day. This information is not intended to replace advice given to you by your health care provider. Make sure you discuss any questions you have with your health care provider. Document Revised: 12/21/2018 Document Reviewed: 06/21/2018 Elsevier Patient Education  2020 [employer].

## 2020-07-13 ENCOUNTER — Telehealth: Payer: Self-pay | Admitting: Family Medicine

## 2020-07-17 ENCOUNTER — Telehealth: Payer: Self-pay | Admitting: Family Medicine

## 2020-07-21 ENCOUNTER — Ambulatory Visit (HOSPITAL_COMMUNITY)
Admission: RE | Admit: 2020-07-21 | Discharge: 2020-07-21 | Disposition: A | Discharge status: Home / Self Care | Payer: Medicaid Other | Source: Ambulatory Visit | Attending: Family Medicine | Admitting: Family Medicine

## 2020-07-21 ENCOUNTER — Other Ambulatory Visit: Payer: Self-pay

## 2020-07-21 ENCOUNTER — Other Ambulatory Visit: Payer: Self-pay | Admitting: Family Medicine

## 2020-07-21 DIAGNOSIS — R102 Pelvic and perineal pain: Secondary | ICD-10-CM

## 2020-07-21 DIAGNOSIS — R109 Unspecified abdominal pain: Secondary | ICD-10-CM | POA: Diagnosis not present

## 2020-07-21 DIAGNOSIS — N854 Malposition of uterus: Secondary | ICD-10-CM | POA: Diagnosis not present

## 2020-07-21 DIAGNOSIS — R188 Other ascites: Secondary | ICD-10-CM | POA: Diagnosis not present

## 2020-07-28 ENCOUNTER — Telehealth: Payer: Self-pay | Admitting: Family Medicine

## 2020-07-28 NOTE — Telephone Encounter (Signed)
Mom of pt called back about results of ultrasound and next steps

## 2020-07-29 NOTE — Telephone Encounter (Signed)
Mom aware of lab results.

## 2020-08-03 ENCOUNTER — Telehealth: Payer: Self-pay | Admitting: Family Medicine

## 2020-08-03 DIAGNOSIS — R102 Pelvic and perineal pain: Secondary | ICD-10-CM

## 2020-08-03 NOTE — Telephone Encounter (Signed)
Angela Adam, RMA  07/29/2020  2:30 PM EST     lmtcb   Angela Adam, RMA  07/29/2020  9:02 AM EST     Mom aware and verbalizes understanding- Mom would like to know what are the next steps since she is still having pain?    Vivien Rossetti [place], LPN  58/83/2549 11:25 AM EST     lmtcb   Raliegh Ip, DO  07/28/2020  9:03 AM EST     Pelvic ultrasound showed no explanation for suprapubic abdominal pain.     I am not sure if I just did not route the question to you or I can not see your response.  Please advise so I can return moms call.

## 2020-08-03 NOTE — Telephone Encounter (Signed)
Referral is in. Ccing courtney. Please make sure that Claire Stewart's notes and the pelvic u/s make it to the referral.

## 2020-08-03 NOTE — Telephone Encounter (Signed)
That's weird.  I definitely saw and responded.  Anyway, previous response was that we can refer to a urogynecologist for further evaluation if she desires.

## 2020-08-03 NOTE — Telephone Encounter (Signed)
mom of pt is wanting to talk about next steps after finding our results of ultrasound because pt is still in pain

## 2020-08-03 NOTE — Telephone Encounter (Signed)
Mom would like to go ahead and have Dr. Reece Agar place the referral since patient is still in pain. Aware that referral will be placed and we will contact her within a week about her referral.
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
## 2020-08-24 ENCOUNTER — Other Ambulatory Visit: Payer: Self-pay

## 2020-08-24 ENCOUNTER — Ambulatory Visit (INDEPENDENT_AMBULATORY_CARE_PROVIDER_SITE_OTHER): Payer: Medicaid Other | Admitting: Obstetrics and Gynecology

## 2020-08-24 ENCOUNTER — Encounter: Payer: Self-pay | Admitting: Obstetrics and Gynecology

## 2020-08-24 VITALS — BP 130/85 | HR 90 | Ht 64.5 in | Wt 180.0 lb

## 2020-08-24 DIAGNOSIS — R102 Pelvic and perineal pain unspecified side: Secondary | ICD-10-CM

## 2020-08-24 DIAGNOSIS — M62838 Other muscle spasm: Secondary | ICD-10-CM | POA: Diagnosis not present

## 2020-08-24 DIAGNOSIS — R350 Frequency of micturition: Secondary | ICD-10-CM | POA: Diagnosis not present

## 2020-08-24 LAB — POCT URINALYSIS DIPSTICK
Appearance: NORMAL
Bilirubin, UA: NEGATIVE
Blood, UA: NEGATIVE
Glucose, UA: NEGATIVE
Ketones, UA: NEGATIVE
Leukocytes, UA: NEGATIVE
Nitrite, UA: NEGATIVE
Protein, UA: NEGATIVE
Spec Grav, UA: 1.01 (ref 1.010–1.025)
Urobilinogen, UA: 0.2 E.U./dL
pH, UA: 7 (ref 5.0–8.0)

## 2020-08-24 MED ORDER — CYCLOBENZAPRINE HCL 5 MG PO TABS: 5.0000 mg | ORAL_TABLET | Freq: Every evening | ORAL | 0 refills | Status: DC | PRN

## 2020-08-24 NOTE — Patient Instructions (Signed)
Start Flexeril 5mg  at night as needed.   Also take birth control pills continuously. Take 3 weeks then start a new pack.

## 2020-08-31 ENCOUNTER — Encounter: Payer: Self-pay | Admitting: Neurology

## 2020-08-31 ENCOUNTER — Ambulatory Visit: Payer: Medicaid Other | Admitting: Neurology

## 2020-08-31 ENCOUNTER — Telehealth: Payer: Self-pay | Admitting: *Deleted

## 2020-08-31 VITALS — BP 141/97 | HR 60 | Ht 64.5 in | Wt 181.0 lb

## 2020-08-31 DIAGNOSIS — G43709 Chronic migraine without aura, not intractable, without status migrainosus: Secondary | ICD-10-CM | POA: Diagnosis not present

## 2020-08-31 DIAGNOSIS — G40909 Epilepsy, unspecified, not intractable, without status epilepticus: Secondary | ICD-10-CM | POA: Diagnosis not present

## 2020-08-31 DIAGNOSIS — R9089 Other abnormal findings on diagnostic imaging of central nervous system: Secondary | ICD-10-CM

## 2020-08-31 DIAGNOSIS — R6250 Unspecified lack of expected normal physiological development in childhood: Secondary | ICD-10-CM | POA: Diagnosis not present

## 2020-08-31 MED ORDER — PROCHLORPERAZINE MALEATE 10 MG PO TABS: 10.0000 mg | ORAL_TABLET | Freq: Three times a day (TID) | ORAL | 6 refills | Status: DC | PRN

## 2020-08-31 MED ORDER — MELOXICAM 7.5 MG PO TABS: 7.5000 mg | ORAL_TABLET | Freq: Every day | ORAL | 5 refills | Status: DC | PRN

## 2020-08-31 MED ORDER — TOPIRAMATE 100 MG PO TABS: 100.0000 mg | ORAL_TABLET | Freq: Two times a day (BID) | ORAL | 4 refills | Status: DC

## 2020-08-31 MED ORDER — LEVETIRACETAM ER 750 MG PO TB24: 3000.0000 mg | ORAL_TABLET | Freq: Every day | ORAL | 4 refills | Status: DC

## 2020-08-31 MED ORDER — SUMATRIPTAN SUCCINATE 6 MG/0.5ML ~~LOC~~ SOAJ: 6.0000 mg | SUBCUTANEOUS | 11 refills | Status: DC | PRN

## 2020-08-31 NOTE — Progress Notes (Signed)
PATIENT: Claire Stewart DOB: May 01, 2001  Chief Complaint  Patient presents with  . Seizures    She is here with her mother, Claire Stewart. Still doing well on current Keppra XR dose. Last seizure in 2016. They would like to review her MRI brain and EEG results. They are requesting a refill of DialStat to have on hand.  . Migraine    Notes improvement in migraine frequency with the increase in topiramate 100mg  BID. Rizatriptan melts did not help as much as the nasal spray. The nasal spray sometimes worked and sometimes not. It depends on how early she should used it.      HISTORICAL  Claire Stewart is a 19 year old female, seen in request by her primary care nurse practitioner 12 evaluation of seizure, learning disability, migraine headache, she is accompanied by her mother at today's clinical visit on February 27, 2020  I reviewed and summarized the referring note. She moved from February 29, 2020 to [place] recently, was previously under the care of neurologist for seizure, migraine headaches  She was born full-term, but suffered jaundice, at 66 days old, she suffered prolonged seizure, later noted developmental delay, was found to have significant visual impairment, she has been homeschooled,  She was treated with antiepileptic medication as an infant following her initial prolonged seizure, but medicine was tapered off,  By 19 years old, she was noted to have frequent staring off spells, eventually was diagnosed with partial seizure, has been treated with Keppra titrating dose, currently taking extended release 750 mg 4 tablets at nighttime, works well for her, last seizure was on May 12, 2015, she had a sudden onset of staring spells,  She has a long history of migraine headaches, now having 1-3 times each month, taking Topamax 50/100 mg as preventive medication, Imitrex works most of the time, but sometimes her headache is so severe, with significant light noise sensitivity,  Imitrex would not work for those prolonged severe migraine headaches  She is legally blind,can read large print only,  UPDATE Aug 31 2020: She is accompanied by her mother at today's clinical visit, She had no recurrent seizure, tolerating Keppra xr 750 mg 4 tablets every night  She continue has frequent migraine headaches, often preceded by blurry vision, about once a week, as to be absent from her home schooling, Maxalt dissolvable only provide limited help, felt Imitrex nasal spray works better for her in the past  We personally reviewed MRI of the brain with and without contrast in July 2021, bilateral occipital, parietal occipital cystic encephalomalacia, gliosis, no acute abnormality  EEG was normal in July 2021  REVIEW OF SYSTEMS: Full 14 system review of systems performed and notable only for as above All other review of systems were negative.  ALLERGIES: Allergies  Allergen Reactions  . Sulfa Antibiotics Hives  . Levetiracetam Other (See Comments)    Can not have brand name- August 2021- caused nerve pain     HOME MEDICATIONS: Current Outpatient Medications  Medication Sig Dispense Refill  . cetirizine (ZYRTEC) 10 MG tablet Take 1 tablet (10 mg total) by mouth daily. 90 tablet 1  . cyclobenzaprine (FLEXERIL) 5 MG tablet Take 1 tablet (5 mg total) by mouth at bedtime as needed for muscle spasms. 30 tablet 0  . famotidine (PEPCID) 20 MG tablet TAKE 1 TABLET BY MOUTH TWICE A DAY 180 tablet 0  . FIBER PO Take 1 tablet by mouth daily.    . Levetiracetam 750 MG TB24 Take 4  tablets (3,000 mg total) by mouth daily. Take 4 tablets at night 360 tablet 4  . magnesium gluconate (MAGONATE) 500 MG tablet Take 500 mg by mouth daily.    . meloxicam (MOBIC) 7.5 MG tablet TAKE 1 TABLET (7.5 MG TOTAL) BY MOUTH AS NEEDED FOR MODERATE PAIN (LIMIT ONCE DAILY TWICE WEEKLY.). 90 tablet 1  . norethindrone-ethinyl estradiol (LOESTRIN 1/20, 21,) 1-20 MG-MCG tablet Take 1 tablet by mouth daily x3  weeks, then stop x1 week, then start new pack. 3 Package 3  . OVER THE COUNTER MEDICATION Gummy multi vit    . OVER THE COUNTER MEDICATION Gummy digestive advantage    . PRESCRIPTION MEDICATION Dialstat    . prochlorperazine (COMPAZINE) 10 MG tablet TAKE 1 TABLET BY MOUTH 2 TIMES A DAY AS NEEDED FOR NAUSEA. 30 tablet 2  . rizatriptan (MAXALT-MLT) 10 MG disintegrating tablet Take 1 tablet (10 mg total) by mouth as needed. May repeat in 2 hours if needed 15 tablet 6  . topiramate (TOPAMAX) 100 MG tablet Take 1 tablet (100 mg total) by mouth 2 (two) times daily. 50mg  AM- 100mg  PM (Patient taking differently: Take 100 mg by mouth 2 (two) times daily.) 180 tablet 4   No current facility-administered medications for this visit.    PAST MEDICAL HISTORY: Past Medical History:  Diagnosis Date  . ADHD   . Autism    mildly  . 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 Cortical visual impairment   . Depression   . Epilepsy (HCC)   . Learning disabilities    global language learning disability  . Legally blind   . Migraines   . Sensory processing difficulty   . Short-term memory loss     PAST SURGICAL HISTORY: Past Surgical History:  Procedure Laterality Date  . MOUTH SURGERY    . WISDOM TOOTH EXTRACTION      FAMILY HISTORY: Family History  Problem Relation Age of Onset  . Hyperlipidemia Mother   . Allergies Mother   . ADD / ADHD Mother   . Depression Mother   . Arthritis Mother   . Alcohol abuse Father   . Drug abuse Father   . Depression Father   . Thyroid disease Father   . Allergies Brother   . ADD / ADHD Brother   . Drug abuse Maternal Grandmother   . Diabetes Maternal Grandmother   . Bipolar disorder Maternal Grandmother   . Hypertension Maternal Grandfather   . Hyperlipidemia Maternal Grandfather   . Depression Maternal Grandfather   . Arthritis Maternal Grandfather   . Cancer Paternal Aunt   . Cancer Cousin        breast    SOCIAL  HISTORY: Social History   Socioeconomic History  . Marital status: Single    Spouse name: Not on file  . Number of children: 0  . Years of education: high school student - home schooled  . Highest education level: Not on file  Occupational History  . Occupation:  Tobacco Use  . Smoking status: Never Smoker  . Smokeless tobacco: Never Used  Vaping Use  . Vaping Use: Never used  Substance and Sexual Activity  . Alcohol use: Never  . Drug use: Never  . Sexual activity: Never  Other Topics Concern  . Not on file  Social History Narrative   Lives with her mother and two younger brothers.   Right-handed.   No daily use of caffeine.  Social Determinants of Health   Financial Resource Strain: Not on file  Food Insecurity: Not on file  Transportation Needs: Not on file  Physical Activity: Not on file  Stress: Not on file  Social Connections: Not on file  Intimate Partner Violence: Not on file     PHYSICAL EXAM   Vitals:   08/31/20 1126  BP: (!) 141/97  Pulse: 60  Weight: 181 lb (82.1 kg)  Height: 5' 4.5" (1.638 m)   Not recorded     Body mass index is 30.59 kg/m.  PHYSICAL EXAMNIATION:  Gen: NAD, conversant, well nourised, well groomed                     Cardiovascular: Regular rate rhythm, no peripheral edema, warm, nontender. Eyes: Conjunctivae clear without exudates or hemorrhage Neck: Supple, no carotid bruits. Pulmonary: Clear to auscultation bilaterally   NEUROLOGICAL EXAM:  MENTAL STATUS: Speech/cognition: Awake, alert, follow commands, but difficulty to comprehend medication instruction, rely on her mother to supplement medical history   CRANIAL NERVES: CN II:   Pupils are round equal and briskly reactive to light.  Decreased peripheral visual field, counting read large print, need close up to read medium-size print CN III, IV, VI: extraocular movement are normal. No ptosis. CN V: Facial sensation is intact to light touch CN VII: Face  is symmetric with normal eye closure  CN VIII: Hearing is normal to causal conversation. CN IX, X: Phonation is normal. CN XI: Head turning and shoulder shrug are intact  MOTOR: There is no pronator drift of out-stretched arms. Muscle bulk and tone are normal. Muscle strength is normal.  REFLEXES: Reflexes are 2+ and symmetric at the biceps, triceps, knees, and ankles. Plantar responses are flexor.  SENSORY: Intact to light touch, pinprick and vibratory sensation are intact in fingers and toes.  COORDINATION: There is no trunk or limb dysmetria noted.  GAIT/STANCE: Needs push-up to get up from seated position, wide-based, cautious   DIAGNOSTIC DATA (LABS, IMAGING, TESTING) - I reviewed patient records, labs, notes, testing and imaging myself where available.   ASSESSMENT AND PLAN  Claire Stewart is a 19 y.o. 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 Ph.D.  [facility] [address], [address] [place], [place] [address] Ph: [phone] Fax: [phone]  CC: Gwenlyn Fudge, FNP  Referring Physician

## 2020-08-31 NOTE — Telephone Encounter (Signed)
PA for sumatriptan injections started on covermymeds (key: [id number]). Pt has coverage with [employer] [place] [employer] [phone]). [id number] approved through 08/31/2021.

## 2020-09-07 ENCOUNTER — Other Ambulatory Visit: Payer: Self-pay | Admitting: Family Medicine

## 2020-09-07 DIAGNOSIS — K219 Gastro-esophageal reflux disease without esophagitis: Secondary | ICD-10-CM

## 2020-09-10 ENCOUNTER — Telehealth: Payer: Self-pay

## 2020-09-10 NOTE — Telephone Encounter (Signed)
Mom States daughter needs ocp changed to take everyday we need to send to [facility] is this okay to do covering Claire Stewart please advise.

## 2020-09-13 NOTE — Telephone Encounter (Signed)
Please get her an appointment ASAP, if she cannot see Shon Hale then have her see somebody else, any medication changes need to be discussed and seen in a visit.

## 2020-09-14 NOTE — Telephone Encounter (Signed)
Spoke with mom, appointment scheduled for 09/21/2020 at 4:00 pm with Harlow Mares

## 2020-09-16 ENCOUNTER — Other Ambulatory Visit: Payer: Self-pay | Admitting: Obstetrics and Gynecology

## 2020-09-16 DIAGNOSIS — M62838 Other muscle spasm: Secondary | ICD-10-CM

## 2020-09-17 NOTE — Telephone Encounter (Signed)
I called and spoke to the mother of Claire Stewart. Claire Stewart is a 19 y.o. female . DPR checked. Claire Stewart said Claire Stewart was given a 30 day supply of flexeril and takes 1 tab every night to help with the pain and to help her sleep. She said she has 1 week supply left and would like to continue this treatment because she has relief.

## 2020-09-17 NOTE — Telephone Encounter (Signed)
-----   Message from Marguerita Beards, MD sent at 09/16/2020  3:45 PM EST ----- Regarding: med refill Hi- I got an automatic prescription request for for refill for flexeril for this patient. Can you check with her and see if she actually needs the refill?  Thanks!

## 2020-09-21 ENCOUNTER — Other Ambulatory Visit: Payer: Self-pay

## 2020-09-21 ENCOUNTER — Encounter: Payer: Self-pay | Admitting: Family Medicine

## 2020-09-21 ENCOUNTER — Ambulatory Visit (INDEPENDENT_AMBULATORY_CARE_PROVIDER_SITE_OTHER): Payer: Managed Care, Other (non HMO) | Admitting: Family Medicine

## 2020-09-21 VITALS — BP 130/78 | HR 94 | Temp 97.9°F | Ht 64.5 in | Wt 179.4 lb

## 2020-09-21 DIAGNOSIS — R102 Pelvic and perineal pain: Secondary | ICD-10-CM

## 2020-09-21 DIAGNOSIS — Z30011 Encounter for initial prescription of contraceptive pills: Secondary | ICD-10-CM

## 2020-09-21 DIAGNOSIS — M62838 Other muscle spasm: Secondary | ICD-10-CM

## 2020-09-21 MED ORDER — DROSPIRENONE-ETHINYL ESTRADIOL 3-0.02 MG PO TABS: ORAL_TABLET | ORAL | 3 refills | Status: DC

## 2020-09-21 MED ORDER — CYCLOBENZAPRINE HCL 5 MG PO TABS: ORAL_TABLET | ORAL | 0 refills | Status: DC

## 2020-09-21 NOTE — Addendum Note (Signed)
Addended by: Gabriel Earing on: 09/21/2020 04:56 PM   Modules accepted: Level of Service

## 2020-09-21 NOTE — Patient Instructions (Signed)
Endometriosis  Endometriosis is a condition in which a tissue similar to the endometrium grows in places outside the uterus. The endometrium is a tissue that forms the lining of the uterus. This tissue can grow in the organs that create the eggs (ovaries), the tubes that carry the eggs to the uterus (fallopian tubes), the vagina, and the bowel. This tissue most often grows on the ovaries and inner lining of the pelvic cavity (peritoneum). When the uterus sheds the endometrium every menstrual cycle, there is bleeding wherever these types of tissue are located. This can cause pain because blood is irritating to tissues that are not normally exposed to it. Endometriosis canalso make it harder for a woman to get pregnant. What are the causes? The cause of this condition is not known. What increases the risk? The following factors may make you more likely to develop this condition: Having a family history of endometriosis. Having never given birth. Starting your period at 10 years of age or younger. What are the signs or symptoms? Often, there are no symptoms of this condition. If you do have symptoms, they may: Vary depending on where the abnormal tissue is growing. Occur during your menstrual period (most often) or at the middle of your cycle. Come and go. You may have no symptoms during some months. Stop when you no longer have your monthly periods (menopause). Symptoms may include: Pain in the area between your hip bones (pelvis). Heavier bleeding during periods. Menstrual periods that happen more than once a month. Pain during sex. Pain in the back or abdomen. Painful bowel movements. Not being able to get pregnant. How is this diagnosed? This condition is diagnosed based on your symptoms and a physical exam. You may have tests, such as: Blood tests and urine tests to help rule out other causes. Ultrasound to look for tissues that are not normal. This is often done over your skin. It is  sometimes done through the vagina (transvaginal). X-ray of the lower bowel (barium enema). CT scan. MRI. To confirm the diagnosis, your health care provider may use a device with a small camera to check tissue inside your abdomen (laparoscopy). Abnormal tissue may be removed and checked in a lab (biopsy). How is this treated? There is no cure for this condition. Treatment focuses on controlling your symptoms. The type of treatment also depends on whether you want to become pregnant in the future. This condition may be treated with: Medicines. These may include: Medicines to relieve pain, including NSAIDs such as ibuprofen. Hormone therapy. This uses artificial hormones to slow the growth of the abnormal tissue. This may include hormonal birth control, such as pills. Surgery to remove the abnormal tissue. During surgery: Tissue may be removed using a laparoscope and a laser (laparoscopic laser treatment). The fallopian tubes, uterus, and ovaries may be removed (hysterectomy). This is done in very severe cases. Follow these instructions at home: Get regular exercise. Limit alcohol use. Eat a balanced diet. Avoid caffeine. Take over-the-counter and prescription medicines only as told by your health care provider. Keep all follow-up visits as told by your health care provider. This is important. Where to find more information American College of Obstetricians and Gynecologists: https://www.acog.org/ Office on Women's Health: https://www.womenshealth.gov/ Contact a health care provider if: You are having new pain or trouble controlling pain. You have problems getting pregnant. You have a fever. Get help right away if you have: Severe pain that does not get better with medicine. Severe nausea and vomiting, or   if you cannot eat or drink without vomiting. Pain that affects your abdomen only on the lower, right side. Pain in your abdomen that gets worse. Swelling in your abdomen. Blood in  your stool (feces). Summary Endometriosis is a condition in which a tissue similar to the endometrium grows in places outside the uterus. The endometrium is a tissue that forms the lining of the uterus. The cause of this condition is not known. This condition may be treated with medicines to relieve pain, hormone therapy, or surgery. If you have this condition, get regular exercise, limit alcohol use, and avoid caffeine. Get help right away if you have severe pain that does not get better with medicine, or if you have severe nausea and vomiting or blood in your stool. This information is not intended to replace advice given to you by your health care provider. Make sure you discuss any questions you have with your healthcare provider. Document Revised: 10/16/2019 Document Reviewed: 10/16/2019 Elsevier Patient Education  2021 Elsevier Inc.  

## 2020-09-21 NOTE — Progress Notes (Signed)
Acute Office Visit  Subjective:    Patient ID: Claire Stewart. Claire Stewart, female    DOB: 09-Apr-2001, 19 y.o.   MRN: [id number]  Chief Complaint  Patient presents with  . Contraception    HPI Patient is in today for contraception management. Claire Stewart was seen by a urogynecologist about 1 month ago for pelvic pain. She had a normal pelvic ultrasound. She was told that she may have endometriosis and that she would benefit form an OCP with a higher estrogen and to take OCPs continuously. She was also given flexeril 5 mg to take at night as needed for muscle spasms with her pelvic pain. She reports that this has been really helpful but she continues to have moderate to severe pelvic pain during the day. She denies side effects of the flexeril.   Past Medical History:  Diagnosis Date  . ADHD   . Autism    mildly  . 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 Cortical visual impairment   . Depression   . Epilepsy (HCC)   . Learning disabilities    global language learning disability  . Legally blind   . Migraines   . Sensory processing difficulty   . Short-term memory loss     Past Surgical History:  Procedure Laterality Date  . MOUTH SURGERY    . WISDOM TOOTH EXTRACTION      Family History  Problem Relation Age of Onset  . Hyperlipidemia Mother   . Allergies Mother   . ADD / ADHD Mother   . Depression Mother   . Arthritis Mother   . Alcohol abuse Father   . Drug abuse Father   . Depression Father   . Thyroid disease Father   . Allergies Brother   . ADD / ADHD Brother   . Drug abuse Maternal Grandmother   . Diabetes Maternal Grandmother   . Bipolar disorder Maternal Grandmother   . Hypertension Maternal Grandfather   . Hyperlipidemia Maternal Grandfather   . Depression Maternal Grandfather   . Arthritis Maternal Grandfather   . Cancer Paternal Aunt   . Cancer Cousin        breast    Social History   Socioeconomic History  . Marital status: Single     Spouse name: Not on file  . Number of children: 0  . Years of education: high school student - home schooled  . Highest education level: Not on file  Occupational History  . Occupation: Consulting civil engineer  Tobacco Use  . Smoking status: Never Smoker  . Smokeless tobacco: Never Used  Vaping Use  . Vaping Use: Never used  Substance and Sexual Activity  . Alcohol use: Never  . Drug use: Never  . Sexual activity: Never  Other Topics Concern  . Not on file  Social History Narrative   Lives with her mother and two younger brothers.   Right-handed.   No daily use of caffeine.   Social Determinants of Health   Financial Resource Strain: Not on file  Food Insecurity: Not on file  Transportation Needs: Not on file  Physical Activity: Not on file  Stress: Not on file  Social Connections: Not on file  Intimate Partner Violence: Not on file    Outpatient Medications Prior to Visit  Medication Sig Dispense Refill  . cetirizine (ZYRTEC) 10 MG tablet Take 1 tablet (10 mg total) by mouth daily. 90 tablet 1  . cyclobenzaprine (FLEXERIL) 5 MG tablet  TAKE 1 TABLET BY MOUTH AT BEDTIME AS NEEDED FOR MUSCLE SPASMS. 30 tablet 0  . famotidine (PEPCID) 20 MG tablet TAKE 1 TABLET BY MOUTH TWICE A DAY 180 tablet 0  . FIBER PO Take 1 tablet by mouth daily.    . Levetiracetam 750 MG TB24 Take 4 tablets (3,000 mg total) by mouth daily. Take 4 tablets at night 360 tablet 4  . magnesium gluconate (MAGONATE) 500 MG tablet Take 500 mg by mouth daily.    . meloxicam (MOBIC) 7.5 MG tablet Take 1 tablet (7.5 mg total) by mouth daily as needed for pain. 60 tablet 5  . norethindrone-ethinyl estradiol (LOESTRIN 1/20, 21,) 1-20 MG-MCG tablet Take 1 tablet by mouth daily x3 weeks, then stop x1 week, then start new pack. 3 Package 3  . OVER THE COUNTER MEDICATION Gummy multi vit    . OVER THE COUNTER MEDICATION Gummy digestive advantage    . PRESCRIPTION MEDICATION Dialstat    . prochlorperazine (COMPAZINE) 10 MG tablet Take  1 tablet (10 mg total) by mouth every 8 (eight) hours as needed for nausea or vomiting. 30 tablet 6  . SUMAtriptan 6 MG/0.5ML SOAJ Inject 6 mg into the skin as needed. 6 mL 11  . topiramate (TOPAMAX) 100 MG tablet Take 1 tablet (100 mg total) by mouth 2 (two) times daily. 180 tablet 4   No facility-administered medications prior to visit.    Allergies  Allergen Reactions  . Sulfa Antibiotics Hives  . Levetiracetam Other (See Comments)    Can not have brand name- [employer]- caused nerve pain     Review of Systems Negative unless specially indicated above in HPI.    Objective:    Physical Exam Vitals and nursing note reviewed.  Constitutional:      Appearance: Normal appearance. She is not ill-appearing, toxic-appearing or diaphoretic.  Cardiovascular:     Rate and Rhythm: Normal rate and regular rhythm.     Heart sounds: Normal heart sounds. No murmur heard.   Pulmonary:     Effort: Pulmonary effort is normal. No respiratory distress.     Breath sounds: Normal breath sounds.  Abdominal:     Tenderness: There is no abdominal tenderness.  Musculoskeletal:     Right lower leg: No edema.     Left lower leg: No edema.  Skin:    General: Skin is warm and dry.  Neurological:     General: No focal deficit present.     Mental Status: She is alert and oriented to person, place, and time.  Psychiatric:        Mood and Affect: Mood normal.        Behavior: Behavior normal.        Thought Content: Thought content normal.        Judgment: Judgment normal.    BP 130/78   Pulse 94   Temp 97.9 F (36.6 C) (Temporal)   Ht 5' 4.5" (1.638 m)   Wt 179 lb 6 oz (81.4 kg)   BMI 30.31 kg/m  Wt Readings from Last 3 Encounters:  09/21/20 179 lb 6 oz (81.4 kg)  08/31/20 181 lb (82.1 kg)  08/24/20 180 lb (81.6 kg)     Lab Results  Component Value Date   TSH 3.730 02/27/2020   Lab Results  Component Value Date   WBC 6.1 02/27/2020   HGB 15.2 02/27/2020   HCT 44.3 02/27/2020    MCV 92 02/27/2020   Lab Results  Component Value Date  NA 141 02/27/2020   K 4.5 02/27/2020   CO2 19 (L) 02/27/2020   GLUCOSE 93 02/27/2020   BUN 8 02/27/2020   CREATININE 0.82 02/27/2020   BILITOT 0.3 02/27/2020   ALKPHOS 99 02/27/2020   AST 14 02/27/2020   ALT 11 02/27/2020   PROT 7.5 02/27/2020   ALBUMIN 4.3 02/27/2020   CALCIUM 9.6 02/27/2020      Assessment & Plan:   Claire Stewart was seen today for contraception.  Diagnoses and all orders for this visit:  Encounter for prescription of oral contraceptives Reviewed note from urogyncologist visit on 08/24/20, possibly has endometriosis. OCPs changes to YAZ and written for continuous use. Return to office for new or worsening symptoms, or if symptoms persist.  -     drospirenone-ethinyl estradiol (YAZ) 3-0.02 MG tablet; Take 1 tablet daily. Discard inactive pills and start a new pack for continuous use.  Muscle spasm/Pelvic pain ? Endometriosis. Tolerating Flexeril without side effects. May take BID prn. She has also been referred for pelvic floor therapy.  -     cyclobenzaprine (FLEXERIL) 5 MG tablet; Take 1 tablet 2 times daily as needed.  Follow up with PCP as needed.    Gabriel Earing, FNP

## 2020-11-12 ENCOUNTER — Other Ambulatory Visit: Payer: Self-pay | Admitting: Family Medicine

## 2020-11-12 DIAGNOSIS — M62838 Other muscle spasm: Secondary | ICD-10-CM

## 2020-11-24 NOTE — Progress Notes (Unsigned)
[facility] Return Visit  SUBJECTIVE  History of Present Illness: Claire Stewart. Claire Stewart is a 20 y.o. female seen in follow-up for pelvic pain/ muscle spasm. Plan at last visit was to start flexeril 5mg  and use OCPs continuously. Has pelvic physical therapy scheduled to start on 3/28.    Is now taking the flexeril twice a day. When she is taking the medication, she feels that her pain has been improved. Denies any increased drowsiness from the medication. However, it does wear off in the middle of the day and the pain returns. Sleep has improved, but still having some pain at night.   She changed to generic Yaz continuously. Now has been two months. She had some spotting and bleeding for a few days when she changed OCPs but ever since has not seen any bleeding.   Past Medical History: Patient  has a past medical history of ADHD, Autism, Brain damage, Cortical visual impairment, Depression, Epilepsy (HCC), Learning disabilities, Legally blind, Migraines, Sensory processing difficulty, and Short-term memory loss.   Past Surgical History: She  has a past surgical history that includes Wisdom tooth extraction and Mouth surgery.   Medications: She has a current medication list which includes the following prescription(s): cetirizine, cyclobenzaprine, cyclobenzaprine, cyclobenzaprine, drospirenone-ethinyl estradiol, famotidine, fiber, levetiracetam, magnesium gluconate, meloxicam, OVER THE COUNTER MEDICATION, OVER THE COUNTER MEDICATION, PRESCRIPTION MEDICATION, prochlorperazine, sumatriptan, and topiramate.   Allergies: Patient is allergic to sulfa antibiotics and levetiracetam.   Social History: Patient  reports that she has never smoked. She has never used smokeless tobacco. She reports that she does not drink alcohol and does not use drugs.      OBJECTIVE     Physical Exam: Vitals:   11/25/20 1550  BP: 120/83  Pulse: (!) 103  Weight: 175 lb (79.4 kg)  Height: 5\' 4"  (1.626 m)    Gen: No apparent distress, A&O x 3.    ASSESSMENT AND PLAN    Claire Stewart is a 20 y.o. with:  1. Muscle spasm    - Since she is having some relief from the flexeril but it is not lasting through the day, will increase to 3 times a day. She would like to start a higher dose at night so she can get some more relief sleeping.  - Prescribed Flexeril 5mg  BID twice a day as needed, and Flexeril 7.5mg  at night as needed. 90 day prescriptions provided with refills.  - She will start with pelvic physical therapy this month.   Follow up 6 months or sooner if needed.   Ninetta Lights, MD  Time spent: I spent 25 minutes dedicated to the care of this patient on the date of this encounter to include pre-visit review of records, face-to-face time with the patient and post visit documentation and ordering medication/ testing.

## 2020-11-25 ENCOUNTER — Other Ambulatory Visit: Payer: Self-pay

## 2020-11-25 ENCOUNTER — Encounter: Payer: Self-pay | Admitting: Obstetrics and Gynecology

## 2020-11-25 ENCOUNTER — Ambulatory Visit (INDEPENDENT_AMBULATORY_CARE_PROVIDER_SITE_OTHER): Payer: Medicaid Other | Admitting: Obstetrics and Gynecology

## 2020-11-25 VITALS — BP 120/83 | HR 103 | Ht 64.0 in | Wt 175.0 lb

## 2020-11-25 DIAGNOSIS — M62838 Other muscle spasm: Secondary | ICD-10-CM | POA: Diagnosis not present

## 2020-11-25 MED ORDER — CYCLOBENZAPRINE HCL 7.5 MG PO TABS: 7.5000 mg | ORAL_TABLET | Freq: Every evening | ORAL | 3 refills | Status: DC | PRN

## 2020-11-25 MED ORDER — CYCLOBENZAPRINE HCL 5 MG PO TABS: 5.0000 mg | ORAL_TABLET | Freq: Two times a day (BID) | ORAL | 3 refills | Status: DC | PRN

## 2020-12-07 ENCOUNTER — Ambulatory Visit: Payer: Managed Care, Other (non HMO) | Attending: Obstetrics and Gynecology | Admitting: Physical Therapy

## 2020-12-07 ENCOUNTER — Other Ambulatory Visit: Payer: Self-pay

## 2020-12-07 ENCOUNTER — Encounter: Payer: Self-pay | Admitting: Physical Therapy

## 2020-12-07 DIAGNOSIS — M6281 Muscle weakness (generalized): Secondary | ICD-10-CM | POA: Diagnosis not present

## 2020-12-07 DIAGNOSIS — R252 Cramp and spasm: Secondary | ICD-10-CM | POA: Insufficient documentation

## 2020-12-07 NOTE — Therapy (Signed)
[facility] [address] [place], [place], [address] Phone: [phone]   Fax:  [phone]  Physical Therapy Evaluation  Patient Details  Name: Claire Stewart MRN: [id number] Date of Birth: 2001/08/01 Referring Provider (PT): Marguerita Beards, MD   Encounter Date: 12/07/2020   PT End of Session - 12/07/20 0817    Visit Number 1    Date for PT Re-Evaluation 03/01/21    Authorization Type healthy blue    PT Start Time 0804    PT Stop Time 0838    PT Time Calculation (min) 34 min    Activity Tolerance Patient tolerated treatment well    Behavior During Therapy [facility] for tasks assessed/performed           Past Medical History:  Diagnosis Date  . ADHD   . Autism    mildly  . 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 Cortical visual impairment   . Depression   . Epilepsy (HCC)   . Learning disabilities    global language learning disability  . Legally blind   . Migraines   . Sensory processing difficulty   . Short-term memory loss     Past Surgical History:  Procedure Laterality Date  . MOUTH SURGERY    . WISDOM TOOTH EXTRACTION      There were no vitals filed for this visit.    Subjective Assessment - 12/07/20 0808    Subjective Pt states she has had pain for a year now.  Mother is present there to help with history due to mild cognitive impairments    Patient is accompained by: Family member    How long can you sit comfortably? no limit    How long can you stand comfortably? no limit    Patient Stated Goals know how to manage pain; reduce    Currently in Pain? Yes    Pain Score 5    9/10 when worst   Pain Location Groin    Pain Orientation Right;Left;Lower    Pain Descriptors / Indicators Stabbing;Pressure    Pain Type Chronic pain    Pain Radiating Towards groin and low abdomen    Pain Onset More than a month ago    Pain Frequency Constant    Aggravating Factors   during cycle, certain positions    Pain Relieving Factors muscle relaxer medicine; warm helps    Effect of Pain on Daily Activities not able to run or walk as much              [facility] PT Assessment - 12/07/20 0001      Assessment   Medical Diagnosis M62.838 (ICD-10-CM) - Muscle spasm    Referring Provider (PT) Marguerita Beards, MD    Onset Date/Surgical Date --   one year   Prior Therapy No      Precautions   Precautions None      Restrictions   Weight Bearing Restrictions No      Balance Screen   Has the patient fallen in the past 6 months No      Home Environment   Living Environment Private residence    Living Arrangements Spouse/significant other   2 brothers, mom     Prior Function   Level of Independence Independent    Vocation Student    Leisure running, walking      Cognition   Overall Cognitive Status Within Functional Limits  for tasks assessed      Functional Tests   Functional tests Squat;Single leg stance      Squat   Comments leans to the Rt side      Single Leg Stance   Comments Lt trendelenburg; Rt      Posture/Postural Control   Posture/Postural Control Postural limitations    Postural Limitations Increased lumbar lordosis;Rounded Shoulders      ROM / Strength   AROM / PROM / Strength PROM      PROM   Overall PROM Comments Rt hip 60% flex and ER; Lt hip 75% flex and ER      Flexibility   Soft Tissue Assessment /Muscle Length yes    Hamstrings 75% Lt; 70% Rt      Palpation   Palpation comment lumbar and gluteals tight; abdomen restriction and heat in Rt lower quadrant; Lt tight , hip flexors tight TTP - Rt more TTP in general      Special Tests    Special Tests --   ASLR   Other special tests ASLR no changes with copmression      Ambulation/Gait   Gait Comments uses cane due to legally blind and pt's mom there for help andhistory due to brain damage epilepsy                      Objective measurements completed  on examination: See above findings.     Pelvic Floor Special Questions - 12/07/20 0001    Prior Pelvic/Prostate Exam No    Currently Sexually Active No    External Palpation outside clothing; able to contract and relax pelvic muscles; no TTP    Exam Type Deferred   has never had a pelvic exam           [facility] Adult PT Treatment/Exercise - 12/07/20 0001      Self-Care   Self-Care Other Self-Care Comments    Other Self-Care Comments  educated on SKTC and h/s stretch in supine                    PT Short Term Goals - 12/07/20 0933      PT SHORT TERM GOAL #1   Title ind with initial HEP    Baseline gave 2 stretches as seen in chart    Time 4    Period Weeks    Status New    Target Date 01/04/21             PT Long Term Goals - 12/07/20 0933      PT LONG TERM GOAL #1   Title Pt will report pain is reduced to <3/10 at least 80% of the time    Time 12    Period Weeks    Status New    Target Date 03/01/21      PT LONG TERM GOAL #2   Title Pt will be ind with advanced HEP for pain management    Time 12    Period Weeks    Status New    Target Date 03/01/21      PT LONG TERM GOAL #3   Title Pt will have no increased TTP to lower abdomen due to improved management of pain and reduced fascial restrictions    Time 12    Period Weeks    Status New    Target Date 03/01/21      PT LONG TERM GOAL #4   Title Pt will  demonstrate 5 squats with correct technique and mechanics with improved core activation for maximum funcitonal activities    Time 12    Period Weeks    Status New    Target Date 03/01/21                  Plan - 12/07/20 1008    Clinical Impression Statement Pt presents to clinic due to pelvic pain.  Pt is being tested for endometriosis and mother is present due to epilepsy disorder with cognitive impairment.  She has had this pain for about one year.  Pt has adhesions in abdomen lower Rt >Lt. Pt has tension in hip and lumbar with decreased  PROM as mentioned above.  Pt has 4-/5 core strength. Pt has tight h/s with reduced ROM Rt>Lt.  She is not TTP around the pelvic floor which was assessed with external palpation and appears to have intact muscle coordination. Pt has tight lumbar and hip flexors.  Pt will benefit from skilled PT to address impairments and help with pain management for full functional activities    Personal Factors and Comorbidities Comorbidity 3+    Comorbidities possibly endo, legally blind, sezures, cognitive impairments    Examination-Participation Restrictions Community Activity    Stability/Clinical Decision Making Stable/Uncomplicated    Clinical Decision Making Moderate    Rehab Potential Excellent    PT Frequency 1x / week    PT Duration 12 weeks    PT Treatment/Interventions ADLs/Self Care Home Management;Biofeedback;Cryotherapy;Electrical Stimulation;Moist Heat;Neuromuscular re-education;Therapeutic exercise;Therapeutic activities;Patient/family education;Passive range of motion;Manual techniques    PT Next Visit Plan stretches and breathing  for lumbar and hip mobility; abdominal fascial release    PT Home Exercise Plan add h/s and knee to chest stretch    Consulted and Agree with Plan of Care Patient           Patient will benefit from skilled therapeutic intervention in order to improve the following deficits and impairments:  Pain,Decreased strength,Decreased coordination,Impaired flexibility,Increased muscle spasms,Increased fascial restricitons  Visit Diagnosis: Cramp and spasm  Muscle weakness (generalized)     Problem List Patient Active Problem List   Diagnosis Date Noted  . Seizure disorder (HCC) 08/31/2020  . Development delay 08/31/2020  . Chronic migraine w/o aura w/o status migrainosus, not intractable 08/31/2020  . Abnormal brain MRI 08/31/2020  . Generalized idiopathic epilepsy and epileptic syndromes, not intractable, with status epilepticus (HCC) 02/27/2020  . Development  disorder, language 02/27/2020  . Chronic migraine 02/27/2020  . Brain damage   . Epilepsy (HCC)   . Learning disabilities   . Migraines   . Sensory processing difficulty   . Short-term memory loss   . Cortical visual impairment     [name], PT 12/07/2020, 5:27 PM  [facility] [address] [place], [place], [address] Phone: [phone]   Fax:  [phone]  Name: Claire Stewart MRN: [id number] Date of Birth: 06-Aug-2001

## 2020-12-10 ENCOUNTER — Other Ambulatory Visit: Payer: Self-pay | Admitting: Family Medicine

## 2020-12-10 DIAGNOSIS — K219 Gastro-esophageal reflux disease without esophagitis: Secondary | ICD-10-CM

## 2020-12-18 ENCOUNTER — Ambulatory Visit: Payer: Managed Care, Other (non HMO) | Attending: Obstetrics and Gynecology | Admitting: Physical Therapy

## 2020-12-18 ENCOUNTER — Encounter: Payer: Self-pay | Admitting: Physical Therapy

## 2020-12-18 ENCOUNTER — Other Ambulatory Visit: Payer: Self-pay

## 2020-12-18 DIAGNOSIS — R252 Cramp and spasm: Secondary | ICD-10-CM

## 2020-12-18 DIAGNOSIS — M6281 Muscle weakness (generalized): Secondary | ICD-10-CM | POA: Insufficient documentation

## 2020-12-18 NOTE — Therapy (Signed)
[facility] [address], [address] [place], [place], [address] Phone: [phone]   Fax:  [phone]  Physical Therapy Treatment  Patient Details  Name: Claire Stewart MRN: [id number] Date of Birth: February 11, 2001 Referring Provider (PT): Marguerita Beards, MD   Encounter Date: 12/18/2020   PT End of Session - 12/18/20 1154    Visit Number 2    Date for PT Re-Evaluation 03/01/21    Authorization Type healthy blue    PT Start Time 0847    PT Stop Time 0928    PT Time Calculation (min) 41 min    Activity Tolerance Patient tolerated treatment well    Behavior During Therapy [facility] for tasks assessed/performed           Past Medical History:  Diagnosis Date  . ADHD   . Autism    mildly  . 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 Cortical visual impairment   . Depression   . Epilepsy (HCC)   . Learning disabilities    global language learning disability  . Legally blind   . Migraines   . Sensory processing difficulty   . Short-term memory loss     Past Surgical History:  Procedure Laterality Date  . MOUTH SURGERY    . WISDOM TOOTH EXTRACTION      There were no vitals filed for this visit.   Subjective Assessment - 12/18/20 1020    Subjective Pt states she is not having pain today which is rare.  Pt has been doing the stretches.    Patient is accompained by: Family member   mom   Patient Stated Goals know how to manage pain; reduce    Currently in Pain? No/denies                             [facility] Adult PT Treatment/Exercise - 12/18/20 0001      Neuro Re-ed    Neuro Re-ed Details  diaphragmatic breathing      Exercises   Exercises Lumbar      Lumbar Exercises: Stretches   Active Hamstring Stretch Right;Left;60 seconds    Double Knee to Chest Stretch 1 rep;60 seconds    Hip Flexor Stretch Right;Left;60 seconds    Quad Stretch Right;Left;60 seconds    ITB  Stretch Right;Left;60 seconds    Figure 4 Stretch 1 rep;60 seconds;Supine;With overpressure    Gastroc Stretch 1 rep;60 seconds    Other Lumbar Stretch Exercise adductor stretch, lumbar and thoracic rotation - 60 sec      Manual Therapy   Manual Therapy Myofascial release    Myofascial Release abdomen and cetrally around uterus - Lt side had more release                  PT Education - 12/18/20 1057    Education Details Access Code: [email or web address]    Person(s) Educated Patient;Parent(s)    Methods Explanation;Demonstration;Tactile cues;Verbal cues;Handout    Comprehension Verbalized understanding;Returned demonstration            PT Short Term Goals - 12/07/20 0933      PT SHORT TERM GOAL #1   Title ind with initial HEP    Baseline gave 2 stretches as seen in chart    Time 4    Period Weeks    Status New    Target Date 01/04/21  PT Long Term Goals - 12/07/20 0933      PT LONG TERM GOAL #1   Title Pt will report pain is reduced to <3/10 at least 80% of the time    Time 12    Period Weeks    Status New    Target Date 03/01/21      PT LONG TERM GOAL #2   Title Pt will be ind with advanced HEP for pain management    Time 12    Period Weeks    Status New    Target Date 03/01/21      PT LONG TERM GOAL #3   Title Pt will have no increased TTP to lower abdomen due to improved management of pain and reduced fascial restrictions    Time 12    Period Weeks    Status New    Target Date 03/01/21      PT LONG TERM GOAL #4   Title Pt will demonstrate 5 squats with correct technique and mechanics with improved core activation for maximum funcitonal activities    Time 12    Period Weeks    Status New    Target Date 03/01/21                 Plan - 12/18/20 1155    Clinical Impression Statement Pt reports less pain today that she has had in a long time as she usually notices it and she has not today.  Pt has been doing stretches given at  evaluation . Pt did well with fascial release and had more tension in left abdomen but released after using manual techniques.  Pt was given more stretches in addition to initial given at eval and educated on breathing with stretching.  Pt will benefit from skilled PT to continue to improve pain management for greatest function.    PT Treatment/Interventions ADLs/Self Care Home Management;Biofeedback;Cryotherapy;Electrical Stimulation;Moist Heat;Neuromuscular re-education;Therapeutic exercise;Therapeutic activities;Patient/family education;Passive range of motion;Manual techniques    PT Next Visit Plan f/u on pain; core and hip strength as tolerated    PT Home Exercise Plan Access Code: [email or web address]    Consulted and Agree with Plan of Care Patient           Patient will benefit from skilled therapeutic intervention in order to improve the following deficits and impairments:  Pain,Decreased strength,Decreased coordination,Impaired flexibility,Increased muscle spasms,Increased fascial restricitons  Visit Diagnosis: Cramp and spasm  Muscle weakness (generalized)     Problem List Patient Active Problem List   Diagnosis Date Noted  . Seizure disorder (HCC) 08/31/2020  . Development delay 08/31/2020  . Chronic migraine w/o aura w/o status migrainosus, not intractable 08/31/2020  . Abnormal brain MRI 08/31/2020  . Generalized idiopathic epilepsy and epileptic syndromes, not intractable, with status epilepticus (HCC) 02/27/2020  . Development disorder, language 02/27/2020  . Chronic migraine 02/27/2020  . Brain damage   . Epilepsy (HCC)   . Learning disabilities   . Migraines   . Sensory processing difficulty   . Short-term memory loss   . Cortical visual impairment     [name], PT 12/18/2020, 12:05 PM  [facility] [address] [place], [place], [address] Phone: [phone]   Fax:  [phone]  Name: Claire Wallman.  Stewart MRN: [id number] Date of Birth: Nov 02, 2000

## 2020-12-18 NOTE — Patient Instructions (Signed)
Access Code: [email or web address] URL: [email or web address] Date: 12/18/2020 Prepared by: Dwana Curd  Exercises Supine Diaphragmatic Breathing - 1 x daily - 7 x weekly - 1 sets - 10 reps Hooklying Hamstring Stretch with Strap - 1 x daily - 7 x weekly - 1 sets - 3 reps - 30 sec hold Hip Adductors and Hamstring Stretch with Strap - 1 x daily - 7 x weekly - 1 sets - 3 reps - 30 sec hold Supine ITB Stretch with Strap - 1 x daily - 7 x weekly - 1 sets - 3 reps - 30 sec hold Prone Quadriceps Stretch with Strap - 1 x daily - 7 x weekly - 1 sets - 3 reps - 30sec hold Half Kneeling Hip Flexor Stretch - 1 x daily - 7 x weekly - 3 sets - 10 reps Supine Lower Trunk Rotation - 1 x daily - 7 x weekly - 1 sets - 10 reps - 5 sec hold Sidelying Thoracic Rotation with Open Book - 1 x daily - 7 x weekly - 1 sets - 5 reps - 10 sec hold Quadruped Cat Camel - 1 x daily - 7 x weekly - 3 sets - 10 reps

## 2020-12-23 ENCOUNTER — Other Ambulatory Visit: Payer: Self-pay

## 2020-12-23 ENCOUNTER — Ambulatory Visit: Payer: Managed Care, Other (non HMO) | Admitting: Physical Therapy

## 2020-12-23 DIAGNOSIS — M6281 Muscle weakness (generalized): Secondary | ICD-10-CM | POA: Diagnosis not present

## 2020-12-23 DIAGNOSIS — R252 Cramp and spasm: Secondary | ICD-10-CM

## 2020-12-23 NOTE — Therapy (Signed)
[facility] [address] [place], [place], [address] Phone: [phone]   Fax:  [phone]  Physical Therapy Treatment  Patient Details  Name: Claire Stewart MRN: [id number] Date of Birth: 2000/11/21 Referring Provider (PT): Marguerita Beards, MD   Encounter Date: 12/23/2020   PT End of Session - 12/23/20 1621    Visit Number 3    Date for PT Re-Evaluation 03/01/21    Authorization Type healthy blue    PT Start Time 1618    PT Stop Time 1658    PT Time Calculation (min) 40 min    Activity Tolerance Patient tolerated treatment well    Behavior During Therapy [facility] for tasks assessed/performed           Past Medical History:  Diagnosis Date  . ADHD   . Autism    mildly  . 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 Cortical visual impairment   . Depression   . Epilepsy (HCC)   . Learning disabilities    global language learning disability  . Legally blind   . Migraines   . Sensory processing difficulty   . Short-term memory loss     Past Surgical History:  Procedure Laterality Date  . MOUTH SURGERY    . WISDOM TOOTH EXTRACTION      There were no vitals filed for this visit.   Subjective Assessment - 12/23/20 1712    Subjective Pt states 4/10 pain (not bad) and did not have meds in the afternoon.    Patient Stated Goals know how to manage pain; reduce    Currently in Pain? Yes    Pain Score 4     Pain Location Abdomen                             [facility] Adult PT Treatment/Exercise - 12/23/20 0001      Lumbar Exercises: Stretches   Other Lumbar Stretch Exercise on foam roll - lumbar stretch and rocking; single knee with core activated      Lumbar Exercises: Aerobic   Nustep L5 x 7 min - activate core      Lumbar Exercises: Standing   Other Standing Lumbar Exercises hip abduction level surface and foam - 10x each side    Other Standing Lumbar  Exercises squat - UE touching railing - cue for trunk stab      Lumbar Exercises: Supine   Straight Leg Raise 10 reps      Lumbar Exercises: Sidelying   Clam Right;Left;20 reps                  PT Education - 12/23/20 1702    Education Details Access Code: [email or web address]    Person(s) Educated Patient    Methods Explanation;Demonstration;Tactile cues;Verbal cues;Handout    Comprehension Verbalized understanding;Returned demonstration            PT Short Term Goals - 12/23/20 1702      PT SHORT TERM GOAL #1   Title ind with initial HEP    Status Achieved             PT Long Term Goals - 12/23/20 1702      PT LONG TERM GOAL #1   Title Pt will report pain is reduced to <3/10 at least 80% of the time    Baseline 4/10 currently  Status On-going                 Plan - 12/23/20 1704    Clinical Impression Statement Pt came with her mom who helped explain exercises and since she will be helping as needed at home. Pt is ind with initial HEP and reports she has needed less pain medicine  pt was able to begin strengthening exercises.  She has weakness of left trunk and hip more than right side.  Pt needed cues to reduce trunk leaning in standing and collapse to left side when doing squats.  Pt will benefit from skilled PT to address core and hip strength for reduced increased tone in pelvic floor.    PT Treatment/Interventions ADLs/Self Care Home Management;Biofeedback;Cryotherapy;Electrical Stimulation;Moist Heat;Neuromuscular re-education;Therapeutic exercise;Therapeutic activities;Patient/family education;Passive range of motion;Manual techniques    PT Next Visit Plan f/u on pain; core and hip strength as tolerated    PT Home Exercise Plan Access Code: [email or web address]    Consulted and Agree with Plan of Care Patient           Patient will benefit from skilled therapeutic intervention in order to improve the following deficits and impairments:  Pain,Decreased  strength,Decreased coordination,Impaired flexibility,Increased muscle spasms,Increased fascial restricitons  Visit Diagnosis: Cramp and spasm  Muscle weakness (generalized)     Problem List Patient Active Problem List   Diagnosis Date Noted  . Seizure disorder (HCC) 08/31/2020  . Development delay 08/31/2020  . Chronic migraine w/o aura w/o status migrainosus, not intractable 08/31/2020  . Abnormal brain MRI 08/31/2020  . Generalized idiopathic epilepsy and epileptic syndromes, not intractable, with status epilepticus (HCC) 02/27/2020  . Development disorder, language 02/27/2020  . Chronic migraine 02/27/2020  . Brain damage   . Epilepsy (HCC)   . Learning disabilities   . Migraines   . Sensory processing difficulty   . Short-term memory loss   . Cortical visual impairment     Thereasa Solo 12/23/2020, 5:16 PM  [facility] [address] [place], [place], [address] Phone: [phone]   Fax:  [phone]  Name: Claire Stewart MRN: [id number] Date of Birth: 2001-02-15

## 2020-12-23 NOTE — Patient Instructions (Signed)
Access Code: [email or web address] URL: [email or web address] Date: 12/23/2020 Prepared by: Dwana Curd  Exercises Supine Diaphragmatic Breathing - 1 x daily - 7 x weekly - 1 sets - 10 reps Hooklying Hamstring Stretch with Strap - 1 x daily - 7 x weekly - 1 sets - 3 reps - 30 sec hold Supine ITB Stretch with Strap - 1 x daily - 7 x weekly - 1 sets - 3 reps - 30 sec hold Prone Quadriceps Stretch with Strap - 1 x daily - 7 x weekly - 1 sets - 3 reps - 30sec hold Half Kneeling Hip Flexor Stretch - 1 x daily - 7 x weekly - 3 sets - 10 reps Supine Lower Trunk Rotation - 1 x daily - 7 x weekly - 1 sets - 10 reps - 5 sec hold Sidelying Thoracic Rotation with Open Book - 1 x daily - 7 x weekly - 1 sets - 5 reps - 10 sec hold Quadruped Cat Camel - 1 x daily - 7 x weekly - 3 sets - 10 reps Supine Butterfly Groin Stretch - 1 x daily - 7 x weekly - 3 reps - 1 sets - 30 sec hold Clamshell - 1 x daily - 7 x weekly - 3 sets - 10 reps Straight Leg Raise - 1 x daily - 7 x weekly - 3 sets - 10 reps

## 2020-12-30 ENCOUNTER — Other Ambulatory Visit: Payer: Self-pay

## 2020-12-30 ENCOUNTER — Ambulatory Visit: Payer: Managed Care, Other (non HMO) | Admitting: Physical Therapy

## 2020-12-30 DIAGNOSIS — R252 Cramp and spasm: Secondary | ICD-10-CM | POA: Diagnosis not present

## 2020-12-30 DIAGNOSIS — M6281 Muscle weakness (generalized): Secondary | ICD-10-CM

## 2020-12-30 NOTE — Therapy (Signed)
[facility] [address] [place], [place], [address] Phone: [phone]   Fax:  [phone]  Physical Therapy Treatment  Patient Details  Name: Claire Stewart MRN: [id number] Date of Birth: 22-Jun-2001 Referring Provider (PT): Marguerita Beards, MD   Encounter Date: 12/30/2020   PT End of Session - 12/30/20 1621    Visit Number 4    Date for PT Re-Evaluation 03/01/21    Authorization Type healthy blue    PT Start Time 1616    PT Stop Time 1659    PT Time Calculation (min) 43 min    Activity Tolerance Patient tolerated treatment well    Behavior During Therapy [facility] for tasks assessed/performed           Past Medical History:  Diagnosis Date  . ADHD   . Autism    mildly  . 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 Cortical visual impairment   . Depression   . Epilepsy (HCC)   . Learning disabilities    global language learning disability  . Legally blind   . Migraines   . Sensory processing difficulty   . Short-term memory loss     Past Surgical History:  Procedure Laterality Date  . MOUTH SURGERY    . WISDOM TOOTH EXTRACTION      There were no vitals filed for this visit.   Subjective Assessment - 12/30/20 1618    Subjective Pt and mom states she was in more pain since last visit.  Pt was in 8/10 when waking this morning and is a 3-4/10    Patient Stated Goals know how to manage pain; reduce    Currently in Pain? Yes    Pain Score 4     Pain Location Abdomen    Pain Orientation Right;Left    Pain Descriptors / Indicators Stabbing;Pressure    Pain Type Chronic pain    Pain Onset More than a month ago    Pain Frequency Intermittent    Multiple Pain Sites No                             [facility] Adult PT Treatment/Exercise - 12/30/20 0001      Lumbar Exercises: Stretches   Active Hamstring Stretch Right;Left;60 seconds    ITB Stretch Right;Left;60  seconds      Lumbar Exercises: Aerobic   Nustep L1 x 6 min - activate core - status update      Manual Therapy   Manual Therapy Soft tissue mobilization    Soft tissue mobilization addaday to gluteals    Myofascial Release abdomen and cetrally around uterus; anterior/posterior aproach; spiky ball roll to adductors, quads and hamstrings                    PT Short Term Goals - 12/23/20 1702      PT SHORT TERM GOAL #1   Title ind with initial HEP    Status Achieved             PT Long Term Goals - 12/30/20 1700      PT LONG TERM GOAL #1   Title Pt will report pain is reduced to <3/10 at least 80% of the time    Baseline 4/10 currently, was 8/10 this morning    Status On-going      PT LONG TERM GOAL #  2   Title Pt will be ind with advanced HEP for pain management    Status On-going      PT LONG TERM GOAL #3   Title Pt will have no increased TTP to lower abdomen due to improved management of pain and reduced fascial restrictions    Baseline some tenderness with more than very gentle pressure    Status On-going                 Plan - 12/30/20 1702    Clinical Impression Statement Pt responded well to treatment with slightly reduced pain down to 2/10 after MFR and stretching.  Pt was shown spiky ball roller to use at home . No additional exercises or stretches given due to having increased symptoms after doing the exercises previously.  Will review basic core and hip strength next.    PT Treatment/Interventions ADLs/Self Care Home Management;Biofeedback;Cryotherapy;Electrical Stimulation;Moist Heat;Neuromuscular re-education;Therapeutic exercise;Therapeutic activities;Patient/family education;Passive range of motion;Manual techniques    PT Next Visit Plan f/u on pain, how to do fascial release on herself; core and hip strength as tolerated    PT Home Exercise Plan Access Code: [email or web address]    Consulted and Agree with Plan of Care Patient           Patient  will benefit from skilled therapeutic intervention in order to improve the following deficits and impairments:  Pain,Decreased strength,Decreased coordination,Impaired flexibility,Increased muscle spasms,Increased fascial restricitons  Visit Diagnosis: Cramp and spasm  Muscle weakness (generalized)     Problem List Patient Active Problem List   Diagnosis Date Noted  . Seizure disorder (HCC) 08/31/2020  . Development delay 08/31/2020  . Chronic migraine w/o aura w/o status migrainosus, not intractable 08/31/2020  . Abnormal brain MRI 08/31/2020  . Generalized idiopathic epilepsy and epileptic syndromes, not intractable, with status epilepticus (HCC) 02/27/2020  . Development disorder, language 02/27/2020  . Chronic migraine 02/27/2020  . Brain damage   . Epilepsy (HCC)   . Learning disabilities   . Migraines   . Sensory processing difficulty   . Short-term memory loss   . Cortical visual impairment     [name], PT 12/30/2020, 5:09 PM   [facility] [address] [place], [place], [address] Phone: [phone]   Fax:  [phone]  Name: Claire Stewart MRN: [id number] Date of Birth: August 06, 2001

## 2021-01-14 ENCOUNTER — Ambulatory Visit: Payer: Managed Care, Other (non HMO) | Attending: Obstetrics and Gynecology | Admitting: Physical Therapy

## 2021-01-14 ENCOUNTER — Other Ambulatory Visit: Payer: Self-pay

## 2021-01-14 ENCOUNTER — Encounter: Payer: Self-pay | Admitting: Physical Therapy

## 2021-01-14 DIAGNOSIS — R252 Cramp and spasm: Secondary | ICD-10-CM | POA: Diagnosis present

## 2021-01-14 DIAGNOSIS — M6281 Muscle weakness (generalized): Secondary | ICD-10-CM | POA: Diagnosis present

## 2021-01-14 NOTE — Therapy (Signed)
[facility] [address] [place], [place], [address] Phone: [phone]   Fax:  [phone]  Physical Therapy Treatment  Patient Details  Name: Claire Stewart. [place] MRN: [id number] Date of Birth: [birth year] Referring Provider (PT): Marguerita Beards, MD   Encounter Date: 01/14/2021   PT End of Session - 01/14/21 1635    Visit Number 5    Date for PT Re-Evaluation 03/01/21    Authorization Type healthy blue    PT Start Time 1616    PT Stop Time 1658    PT Time Calculation (min) 42 min    Activity Tolerance Patient tolerated treatment well    Behavior During Therapy [facility] for tasks assessed/performed           Past Medical History:  Diagnosis Date  . ADHD   . Autism    mildly  . 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 Cortical visual impairment   . Depression   . Epilepsy (HCC)   . Learning disabilities    global language learning disability  . Legally blind   . Migraines   . Sensory processing difficulty   . Short-term memory loss     Past Surgical History:  Procedure Laterality Date  . MOUTH SURGERY    . WISDOM TOOTH EXTRACTION      There were no vitals filed for this visit.   Subjective Assessment - 01/14/21 1622    Subjective Pt states she was paddle boarding.  Pain was more after paddle boarding.    Patient Stated Goals know how to manage pain; reduce    Currently in Pain? Yes    Pain Score 4     Pain Location Abdomen    Pain Orientation Right;Left;Lower    Pain Type Chronic pain    Pain Onset More than a month ago    Pain Frequency Intermittent    Multiple Pain Sites No                             [facility] Adult PT Treatment/Exercise - 01/14/21 0001      Self-Care   Other Self-Care Comments  massage on foam noodle      Lumbar Exercises: Seated   Other Seated Lumbar Exercises bouncing and circles on ball      Manual Therapy   Myofascial  Release abdominal centrall and bil                    PT Short Term Goals - 12/23/20 1702      PT SHORT TERM GOAL #1   Title ind with initial HEP    Status Achieved             PT Long Term Goals - 01/14/21 1708      PT LONG TERM GOAL #1   Title Pt will report pain is reduced to <3/10 at least 80% of the time    Baseline 4/10    Status On-going      PT LONG TERM GOAL #3   Title Pt will have no increased TTP to lower abdomen due to improved management of pain and reduced fascial restrictions    Baseline some tenderness with more than very gentle pressure    Status On-going                 Plan - 01/14/21 1704  Clinical Impression Statement Today's treatment focused on things she can do at home for pelvic floor release such as sitting on noodle and ball.  Pt had 4/10 pain throughout.  Pt was also educated how to do the [facility] to abdomen so she can try this at home if it helps.  Pt will benefit from skilled PT continue to work on core strength to tolerance.    Comorbidities possibly endo, legally blind, sezures, cognitive impairments    PT Treatment/Interventions ADLs/Self Care Home Management;Biofeedback;Cryotherapy;Electrical Stimulation;Moist Heat;Neuromuscular re-education;Therapeutic exercise;Therapeutic activities;Patient/family education;Passive range of motion;Manual techniques    PT Next Visit Plan f/u on sitting on ball and pool noodle and if any questions; very basic core and hip strength as tolerated    PT Home Exercise Plan Access Code: [email or web address]    Consulted and Agree with Plan of Care Patient           Patient will benefit from skilled therapeutic intervention in order to improve the following deficits and impairments:  Pain,Decreased strength,Decreased coordination,Impaired flexibility,Increased muscle spasms,Increased fascial restricitons  Visit Diagnosis: No diagnosis found.     Problem List Patient Active Problem List   Diagnosis Date  Noted  . Seizure disorder (HCC) 08/31/2020  . Development delay 08/31/2020  . Chronic migraine w/o aura w/o status migrainosus, not intractable 08/31/2020  . Abnormal brain MRI 08/31/2020  . Generalized idiopathic epilepsy and epileptic syndromes, not intractable, with status epilepticus (HCC) 02/27/2020  . Development disorder, language 02/27/2020  . Chronic migraine 02/27/2020  . Brain damage   . Epilepsy (HCC)   . Learning disabilities   . Migraines   . Sensory processing difficulty   . Short-term memory loss   . Cortical visual impairment     [name], PT 01/14/2021, 5:11 PM  [facility] [address] [place], [place], [address] Phone: [phone]   Fax:  [phone]  Name: Claire Stewart. [place] MRN: [id number] Date of Birth: [birth year]

## 2021-01-21 ENCOUNTER — Encounter: Payer: Self-pay | Admitting: Physical Therapy

## 2021-01-21 ENCOUNTER — Other Ambulatory Visit: Payer: Self-pay

## 2021-01-21 ENCOUNTER — Ambulatory Visit: Payer: Managed Care, Other (non HMO) | Admitting: Physical Therapy

## 2021-01-21 DIAGNOSIS — R252 Cramp and spasm: Secondary | ICD-10-CM | POA: Diagnosis not present

## 2021-01-21 DIAGNOSIS — M6281 Muscle weakness (generalized): Secondary | ICD-10-CM

## 2021-01-21 NOTE — Therapy (Signed)
[facility] [address], [address] [place], [place], [address] Phone: [phone]   Fax:  [phone]  Physical Therapy Treatment  Patient Details  Name: Claire Stewart MRN: [id number] Date of Birth: 01-20-2001 Referring Provider (PT): Marguerita Beards, MD   Encounter Date: 01/21/2021   PT End of Session - 01/21/21 0939    Visit Number 6    Date for PT Re-Evaluation 03/01/21    Authorization Type healthy blue    Authorization - Visit Number 5    Authorization - Number of Visits 12    PT Start Time 0801    PT Stop Time 0842    PT Time Calculation (min) 41 min    Activity Tolerance Patient tolerated treatment well    Behavior During Therapy [facility] for tasks assessed/performed           Past Medical History:  Diagnosis Date  . ADHD   . Autism    mildly  . 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 Cortical visual impairment   . Depression   . Epilepsy (HCC)   . Learning disabilities    global language learning disability  . Legally blind   . Migraines   . Sensory processing difficulty   . Short-term memory loss     Past Surgical History:  Procedure Laterality Date  . MOUTH SURGERY    . WISDOM TOOTH EXTRACTION      There were no vitals filed for this visit.   Subjective Assessment - 01/21/21 0957    Subjective Pt reports feeling more sore with more activity but the stretches help    Currently in Pain? Yes    Pain Score 3     Pain Location Abdomen    Pain Orientation Right;Left;Lower    Pain Descriptors / Indicators Aching    Pain Type Chronic pain    Multiple Pain Sites No                             [facility] Adult PT Treatment/Exercise - 01/21/21 0001      Neuro Re-ed    Neuro Re-ed Details  diaphragmatic breathing      Lumbar Exercises: Stretches   Figure 4 Stretch 1 rep;60 seconds;Supine;With overpressure    Other Lumbar Stretch Exercise breathing into  pelvic floor; posture in sitting with breathing    Other Lumbar Stretch Exercise child pose      Lumbar Exercises: Standing   Other Standing Lumbar Exercises pec stretch in doorway    Other Standing Lumbar Exercises sitting on pball circles                    PT Short Term Goals - 12/23/20 1702      PT SHORT TERM GOAL #1   Title ind with initial HEP    Status Achieved             PT Long Term Goals - 01/21/21 7673      PT LONG TERM GOAL #1   Title Pt will report pain is reduced to <3/10 at least 80% of the time    Baseline better and mostly 4/10 at most, 3/10 after pain medicine    Status On-going      PT LONG TERM GOAL #2   Title Pt will be ind with advanced HEP for pain management  PT LONG TERM GOAL #3   Title Pt will have no increased TTP to lower abdomen due to improved management of pain and reduced fascial restrictions    Baseline some tenderness with more than very gentle pressure      PT LONG TERM GOAL #4   Title Pt will demonstrate 5 squats with correct technique and mechanics with improved core activation for maximum funcitonal activities    Status On-going                 Plan - 01/21/21 0940    Clinical Impression Statement Today's treatment focused on diaphragmatic breathing to do along with stretches.  Pt continues to report improved overall especially with stretches, but still taking pain medicine.  She demonstrates slumped posture, rounded shoulders.  Pt did well with stretches and will benefit from skilled PT to continue to progress postural strength and muscle coordination for improved body awareness.    PT Treatment/Interventions ADLs/Self Care Home Management;Biofeedback;Cryotherapy;Electrical Stimulation;Moist Heat;Neuromuscular re-education;Therapeutic exercise;Therapeutic activities;Patient/family education;Passive range of motion;Manual techniques    PT Next Visit Plan can start with nustep; basic core and hip strength if  tolerated; postural strength shoulder sitting on ball    PT Home Exercise Plan Access Code: [email or web address]    Consulted and Agree with Plan of Care Patient           Patient will benefit from skilled therapeutic intervention in order to improve the following deficits and impairments:  Pain,Decreased strength,Decreased coordination,Impaired flexibility,Increased muscle spasms,Increased fascial restricitons  Visit Diagnosis: Cramp and spasm  Muscle weakness (generalized)     Problem List Patient Active Problem List   Diagnosis Date Noted  . Seizure disorder (HCC) 08/31/2020  . Development delay 08/31/2020  . Chronic migraine w/o aura w/o status migrainosus, not intractable 08/31/2020  . Abnormal brain MRI 08/31/2020  . Generalized idiopathic epilepsy and epileptic syndromes, not intractable, with status epilepticus (HCC) 02/27/2020  . Development disorder, language 02/27/2020  . Chronic migraine 02/27/2020  . Brain damage   . Epilepsy (HCC)   . Learning disabilities   . Migraines   . Sensory processing difficulty   . Short-term memory loss   . Cortical visual impairment     [name], PT 01/21/2021, 10:00 AM  [facility] [address], [address] [place], [place], [address] Phone: [phone]   Fax:  [phone]  Name: Claire Stewart MRN: [id number] Date of Birth: October 05, 2000

## 2021-01-26 ENCOUNTER — Other Ambulatory Visit: Payer: Self-pay | Admitting: Family Medicine

## 2021-01-28 ENCOUNTER — Encounter: Payer: Self-pay | Admitting: Physical Therapy

## 2021-01-28 ENCOUNTER — Ambulatory Visit: Payer: Managed Care, Other (non HMO) | Admitting: Physical Therapy

## 2021-01-28 ENCOUNTER — Other Ambulatory Visit: Payer: Self-pay

## 2021-01-28 DIAGNOSIS — R252 Cramp and spasm: Secondary | ICD-10-CM

## 2021-01-28 DIAGNOSIS — M6281 Muscle weakness (generalized): Secondary | ICD-10-CM | POA: Diagnosis not present

## 2021-01-28 NOTE — Therapy (Signed)
[facility] [address] [place], [place], [address] Phone: [phone]   Fax:  [phone]  Physical Therapy Treatment  Patient Details  Name: Claire Stewart. [place] MRN: [id number] Date of Birth: 04/17/2001 Referring Provider (PT): Marguerita Beards, MD   Encounter Date: 01/28/2021   PT End of Session - 01/28/21 1714    Visit Number 7    Date for PT Re-Evaluation 03/01/21    Authorization Type healthy blue    Authorization - Visit Number 6    Authorization - Number of Visits 12    PT Start Time 1612    PT Stop Time 1705    PT Time Calculation (min) 53 min    Activity Tolerance Patient tolerated treatment well    Behavior During Therapy [facility] for tasks assessed/performed           Past Medical History:  Diagnosis Date  . ADHD   . Autism    mildly  . 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 Cortical visual impairment   . Depression   . Epilepsy (HCC)   . Learning disabilities    global language learning disability  . Legally blind   . Migraines   . Sensory processing difficulty   . Short-term memory loss     Past Surgical History:  Procedure Laterality Date  . MOUTH SURGERY    . WISDOM TOOTH EXTRACTION      There were no vitals filed for this visit.   Subjective Assessment - 01/28/21 1616    [place].  Pt states she focused on the posture a little and    Patient Stated Goals know how to manage pain; reduce    Currently in Pain? Yes    Pain Score 5     Pain Location Abdomen    Pain Orientation Right;Left;Lower    Pain Descriptors / Indicators Aching    Pain Type Chronic pain    Pain Onset More than a month ago    Multiple Pain Sites No                             [facility] Adult PT Treatment/Exercise - 01/28/21 0001      Lumbar Exercises: Aerobic   Nustep L1 x 6 min - activate core - status update       Lumbar Exercises: Seated   Other Seated Lumbar Exercises posture with towels and pillow; shoulder circles; shrugs; ER yellow; cervical retraction      Lumbar Exercises: Supine   Other Supine Lumbar Exercises cervical retraction for posture      Manual Therapy   Myofascial Release abdomen liver and transverse colon, did a little central but was too TTP                    PT Short Term Goals - 12/23/20 1702      PT SHORT TERM GOAL #1   Title ind with initial HEP    Status Achieved             PT Long Term Goals - 01/21/21 0807      PT LONG TERM GOAL #1   Title Pt will report pain is reduced to <3/10 at least 80% of the time    Baseline better and mostly 4/10 at most, 3/10 after pain  medicine    Status On-going      PT LONG TERM GOAL #2   Title Pt will be ind with advanced HEP for pain management      PT LONG TERM GOAL #3   Title Pt will have no increased TTP to lower abdomen due to improved management of pain and reduced fascial restrictions    Baseline some tenderness with more than very gentle pressure      PT LONG TERM GOAL #4   Title Pt will demonstrate 5 squats with correct technique and mechanics with improved core activation for maximum funcitonal activities    Status On-going                 Plan - 01/28/21 1715    Clinical Impression Statement Today's session spent a lot of time on posture and educating patient on sitting posture. Pt rounds shoulders a lot and and sits with forward head due to visual impairments in order to see the screen.  Pt needs a lot of cues and monitoring througout as she has a hard time understanding verbal cues.  Mother is there to help interpret.  Pt responded well to myofascial with restrictions around liver and transverse colon.  Pt was breathing more deeply after fascial release today.  Pt will benefit from 2 more visits to address fascial restrictions and follow up with posture.    PT Treatment/Interventions  ADLs/Self Care Home Management;Biofeedback;Cryotherapy;Electrical Stimulation;Moist Heat;Neuromuscular re-education;Therapeutic exercise;Therapeutic activities;Patient/family education;Passive range of motion;Manual techniques    PT Next Visit Plan nustep; fascial release; cervical retraction and band in supine    PT Home Exercise Plan Access Code: [email or web address]    Consulted and Agree with Plan of Care Patient           Patient will benefit from skilled therapeutic intervention in order to improve the following deficits and impairments:  Pain,Decreased strength,Decreased coordination,Impaired flexibility,Increased muscle spasms,Increased fascial restricitons  Visit Diagnosis: Cramp and spasm  Muscle weakness (generalized)     Problem List Patient Active Problem List   Diagnosis Date Noted  . Seizure disorder (HCC) 08/31/2020  . Development delay 08/31/2020  . Chronic migraine w/o aura w/o status migrainosus, not intractable 08/31/2020  . Abnormal brain MRI 08/31/2020  . Generalized idiopathic epilepsy and epileptic syndromes, not intractable, with status epilepticus (HCC) 02/27/2020  . Development disorder, language 02/27/2020  . Chronic migraine 02/27/2020  . Brain damage   . Epilepsy (HCC)   . Learning disabilities   . Migraines   . Sensory processing difficulty   . Short-term memory loss   . Cortical visual impairment     [name], PT 01/28/2021, 5:30 PM  [facility] [address] [place], [place], [address] Phone: [phone]   Fax:  [phone]  Name: Indra Wolters. [place] MRN: [id number] Date of Birth: 06-28-2001

## 2021-02-03 ENCOUNTER — Ambulatory Visit: Payer: Managed Care, Other (non HMO) | Admitting: Physical Therapy

## 2021-02-03 ENCOUNTER — Other Ambulatory Visit: Payer: Self-pay

## 2021-02-03 ENCOUNTER — Encounter: Payer: Self-pay | Admitting: Physical Therapy

## 2021-02-03 DIAGNOSIS — R252 Cramp and spasm: Secondary | ICD-10-CM

## 2021-02-03 DIAGNOSIS — M6281 Muscle weakness (generalized): Secondary | ICD-10-CM | POA: Diagnosis not present

## 2021-02-03 NOTE — Therapy (Signed)
[facility] [address] [place], [place], [address] Phone: [phone]   Fax:  [phone]  Physical Therapy Treatment  Patient Details  Name: Claire Stewart. Klugh MRN: [id number] Date of Birth: [birth year] Referring Provider (PT): Marguerita Beards, MD   Encounter Date: 02/03/2021   PT End of Session - 02/03/21 1703    Visit Number 8    Date for PT Re-Evaluation 03/01/21    Authorization Type healthy blue    Authorization - Visit Number 7    Authorization - Number of Visits 12    PT Start Time 1615    PT Stop Time 1655    PT Time Calculation (min) 40 min    Activity Tolerance Patient tolerated treatment well    Behavior During Therapy [facility] for tasks assessed/performed           Past Medical History:  Diagnosis Date  . ADHD   . Autism    mildly  . 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 Cortical visual impairment   . Depression   . Epilepsy (HCC)   . Learning disabilities    global language learning disability  . Legally blind   . Migraines   . Sensory processing difficulty   . Short-term memory loss     Past Surgical History:  Procedure Laterality Date  . MOUTH SURGERY    . WISDOM TOOTH EXTRACTION      There were no vitals filed for this visit.   Subjective Assessment - 02/03/21 1702    Subjective Feels about the same and got a breakthrough cycle this week    Currently in Pain? Yes    Pain Score 5     Pain Location Abdomen                             [facility] Adult PT Treatment/Exercise - 02/03/21 0001      Manual Therapy   Myofascial Release abdomen; stomach, liver and transverse colon, ascending and descending colon; cervical and suboccipital                    PT Short Term Goals - 12/23/20 1702      PT SHORT TERM GOAL #1   Title ind with initial HEP    Status Achieved             PT Long Term Goals - 02/03/21 1709      PT  LONG TERM GOAL #1   Title Pt will report pain is reduced to <3/10 at least 80% of the time    Status On-going                 Plan - 02/03/21 1706    Clinical Impression Statement today's session focused on pain management.  Pt still having about the same pain level.  She has tension throughout the right trunk and cervical more than that left side.  Pt did well and no increased pain during treatment. Pt will benefit from at least one more session to ensure ind with HEP and re-assess to see if fascial release has been effective.    PT Treatment/Interventions ADLs/Self Care Home Management;Biofeedback;Cryotherapy;Electrical Stimulation;Moist Heat;Neuromuscular re-education;Therapeutic exercise;Therapeutic activities;Patient/family education;Passive range of motion;Manual techniques    PT Next Visit Plan nustep; fascial release; cervical retraction and band in supine    PT Home Exercise Plan Access Code:  [email or web address]    Consulted and Agree with Plan of Care Patient           Patient will benefit from skilled therapeutic intervention in order to improve the following deficits and impairments:  Pain,Decreased strength,Decreased coordination,Impaired flexibility,Increased muscle spasms,Increased fascial restricitons  Visit Diagnosis: Cramp and spasm  Muscle weakness (generalized)     Problem List Patient Active Problem List   Diagnosis Date Noted  . Seizure disorder (HCC) 08/31/2020  . Development delay 08/31/2020  . Chronic migraine w/o aura w/o status migrainosus, not intractable 08/31/2020  . Abnormal brain MRI 08/31/2020  . Generalized idiopathic epilepsy and epileptic syndromes, not intractable, with status epilepticus (HCC) 02/27/2020  . Development disorder, language 02/27/2020  . Chronic migraine 02/27/2020  . Brain damage   . Epilepsy (HCC)   . Learning disabilities   . Migraines   . Sensory processing difficulty   . Short-term memory loss   . Cortical visual  impairment     [name], PT 02/03/2021, 5:14 PM  [facility] [address] [place], [place], [address] Phone: [phone]   Fax:  [phone]  Name: Lynnex Fulp. [place] MRN: [id number] Date of Birth: 09-Jun-2001

## 2021-02-11 ENCOUNTER — Encounter: Payer: Self-pay | Admitting: Physical Therapy

## 2021-02-11 ENCOUNTER — Other Ambulatory Visit: Payer: Self-pay

## 2021-02-11 ENCOUNTER — Ambulatory Visit: Payer: Managed Care, Other (non HMO) | Attending: Obstetrics and Gynecology | Admitting: Physical Therapy

## 2021-02-11 DIAGNOSIS — R252 Cramp and spasm: Secondary | ICD-10-CM | POA: Diagnosis present

## 2021-02-11 DIAGNOSIS — M6281 Muscle weakness (generalized): Secondary | ICD-10-CM | POA: Insufficient documentation

## 2021-02-11 NOTE — Therapy (Signed)
[facility] [address], [place], [place], [address] Phone: [phone]   Fax:  [phone]  Physical Therapy Treatment  Patient Details  Name: Claire Stewart MRN: [id number] Date of Birth: [birth year] Referring Provider (PT): Jaquita Folds, MD   Encounter Date: 02/11/2021   PT End of Session - 02/11/21 1704    Visit Number 9    Date for PT Re-Evaluation 03/01/21    Authorization Type healthy blue    Authorization - Visit Number 8    Authorization - Number of Visits 12    PT Start Time 1618    PT Stop Time 4097    PT Time Calculation (min) 40 min    Activity Tolerance Patient tolerated treatment well    Behavior During Therapy [facility] for tasks assessed/performed           Past Medical History:  Diagnosis Date  . ADHD   . Autism    mildly  . 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 Cortical visual impairment   . Depression   . Epilepsy ([place])   . Learning disabilities    global language learning disability  . Legally blind   . Migraines   . Sensory processing difficulty   . Short-term memory loss     Past Surgical History:  Procedure Laterality Date  . MOUTH SURGERY    . WISDOM TOOTH EXTRACTION      There were no vitals filed for this visit.   Subjective Assessment - 02/11/21 1622    Subjective The last couple days my pain has been more hip pain.  I feel pain in my sides.    Currently in Pain? Yes    Pain Score 3     Pain Location Abdomen    Pain Orientation Right;Left;Lower    Pain Descriptors / Indicators Aching                             [facility] Adult PT Treatment/Exercise - 02/11/21 0001      Manual Therapy   Myofascial Release throughout spine fascial release with craniosacrol techniques                    PT Short Term Goals - 12/23/20 1702      PT SHORT TERM GOAL #1   Title ind with initial HEP    Status Achieved              PT Long Term Goals - 02/11/21 1713      PT LONG TERM GOAL #1   Title Pt will report pain is reduced to <3/10 at least 80% of the time    Baseline has been 2-3/10 lately; only problem is when I get my period    Status Partially Met      PT LONG TERM GOAL #2   Title Pt will be ind with advanced HEP for pain management    Status Achieved      PT LONG TERM GOAL #3   Title Pt will have no increased TTP to lower abdomen due to improved management of pain and reduced fascial restrictions    Baseline better but still TTP lower abdomen    Status Partially Met      PT LONG TERM GOAL #4   Title Pt will demonstrate 5 squats with correct technique and mechanics with improved core  activation for maximum funcitonal activities    Status Deferred                 Plan - 02/11/21 1715    Clinical Impression Statement Pt is ind with HEP and has met or partially met goals.  pt has been consistent with lower pain but hasn't improved beyond current level for several weeks and will d/c from skilled PT today.    PT Treatment/Interventions ADLs/Self Care Home Management;Biofeedback;Cryotherapy;Electrical Stimulation;Moist Heat;Neuromuscular re-education;Therapeutic exercise;Therapeutic activities;Patient/family education;Passive range of motion;Manual techniques    PT Next Visit Plan d/c today    PT Home Exercise Plan Access Code: [email or web address]    Consulted and Agree with Plan of Care Patient           Patient will benefit from skilled therapeutic intervention in order to improve the following deficits and impairments:  Pain,Decreased strength,Decreased coordination,Impaired flexibility,Increased muscle spasms,Increased fascial restricitons  Visit Diagnosis: Cramp and spasm  Muscle weakness (generalized)     Problem List Patient Active Problem List   Diagnosis Date Noted  . Seizure disorder ([place]) 08/31/2020  . Development delay 08/31/2020  . Chronic migraine w/o aura w/o status  migrainosus, not intractable 08/31/2020  . Abnormal brain MRI 08/31/2020  . Generalized idiopathic epilepsy and epileptic syndromes, not intractable, with status epilepticus ([place]) 02/27/2020  . Development disorder, language 02/27/2020  . Chronic migraine 02/27/2020  . Brain damage   . Epilepsy ([place])   . Learning disabilities   . Migraines   . Sensory processing difficulty   . Short-term memory loss   . Cortical visual impairment     Jule Ser, PT 02/11/2021, 5:17 PM  [facility] [address], [place], [place], [address] Phone: [phone]   Fax:  [phone]  Name: Claire Stewart MRN: [id number] Date of Birth: 2001-07-31  PHYSICAL THERAPY DISCHARGE SUMMARY  Visits from Start of Care: 8  Current functional level related to goals / functional outcomes: See above goals   Remaining deficits: See above   Education / Equipment: HEP Plan: Patient agrees to discharge.  Patient goals were partially met. Patient is being discharged due to lack of progress.  ?????     [employer], PT 02/11/21 5:18 PM

## 2021-02-15 ENCOUNTER — Telehealth: Payer: Self-pay | Admitting: Family Medicine

## 2021-02-15 NOTE — Telephone Encounter (Signed)
Patient aware and verbalized understanding. °

## 2021-02-15 NOTE — Telephone Encounter (Signed)
No need to give it at leasy=t 2-3 months to see if works properly

## 2021-03-01 ENCOUNTER — Telehealth: Payer: Self-pay | Admitting: Neurology

## 2021-03-01 ENCOUNTER — Ambulatory Visit: Payer: Medicaid Other | Admitting: Neurology

## 2021-03-01 NOTE — Telephone Encounter (Signed)
Patients mother may call Marcelino Duster to ask for refills since 6/20 appt was cancelled. Just Fyi

## 2021-03-01 NOTE — Telephone Encounter (Signed)
OV cancelled due to MD out of office. 

## 2021-04-07 ENCOUNTER — Encounter: Payer: Self-pay | Admitting: Neurology

## 2021-04-07 ENCOUNTER — Ambulatory Visit (INDEPENDENT_AMBULATORY_CARE_PROVIDER_SITE_OTHER): Payer: Managed Care, Other (non HMO) | Admitting: Neurology

## 2021-04-07 VITALS — BP 127/85 | HR 103 | Ht 64.0 in | Wt 186.5 lb

## 2021-04-07 DIAGNOSIS — G40909 Epilepsy, unspecified, not intractable, without status epilepticus: Secondary | ICD-10-CM

## 2021-04-07 DIAGNOSIS — R6250 Unspecified lack of expected normal physiological development in childhood: Secondary | ICD-10-CM | POA: Diagnosis not present

## 2021-04-07 DIAGNOSIS — G43709 Chronic migraine without aura, not intractable, without status migrainosus: Secondary | ICD-10-CM

## 2021-04-07 MED ORDER — LEVETIRACETAM ER 750 MG PO TB24: 3000.0000 mg | ORAL_TABLET | Freq: Every day | ORAL | 4 refills | Status: DC

## 2021-04-07 MED ORDER — TOPIRAMATE 100 MG PO TABS: 100.0000 mg | ORAL_TABLET | Freq: Two times a day (BID) | ORAL | 4 refills | Status: DC

## 2021-04-07 MED ORDER — RIZATRIPTAN BENZOATE 10 MG PO TBDP
10.0000 mg | ORAL_TABLET | ORAL | 11 refills | Status: DC | PRN
Start: 2021-04-07 — End: 2022-05-24

## 2021-04-07 NOTE — Progress Notes (Signed)
PATIENT: Claire Stewart DOB: August 01, 2001  Chief Complaint  Patient presents with   Follow-up    Room 12 w/ her mother, [name]. No seizures reported. She is happy with her current preventive migraine therapy. Reports having about two migraines each month. They respond well to sumatriptan injections but she does not like giving herself injections. She is asking to go back to rizatriptan melts.     ASSESSMENT AND PLAN  Claire Stewart is a 20 y.o. 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  Refilled her Keppra XR  750 mg 4 tablets every night  Keep Topamax 100 mg twice a day  She prefer to go back on Maxalt 10mg  prn   DIAGNOSTIC DATA (LABS, IMAGING, TESTING) - I reviewed patient records, labs, notes, testing and imaging myself where available.   HISTORICAL: Claire Stewart is a 20 year old female, seen in request by her primary care nurse practitioner 12 evaluation of seizure, learning disability, migraine headache, she is accompanied by her mother at today's clinical visit on February 27, 2020  I reviewed and summarized the referring note. She moved from February 29, 2020 to [place] recently, was previously under the care of neurologist for seizure, migraine headaches  She was born full-term, but suffered jaundice, at 26 days old, she suffered prolonged seizure, later noted developmental delay, was found to have significant visual impairment, she has been homeschooled,  She was treated with antiepileptic medication as an infant following her initial prolonged seizure, but medicine was tapered off,  By 20 years old, she was noted to have frequent staring off spells, eventually was diagnosed with partial seizure, has been treated with Keppra titrating dose, currently taking extended release 750 mg 4 tablets at nighttime,  works well for her, last seizure was on May 12, 2015, she had a sudden onset of staring spells,  She has a long history of migraine headaches, now having 1-3 times each month, taking Topamax 50/100 mg as preventive medication, Imitrex works most of the time, but sometimes her headache is so severe, with significant light noise sensitivity, Imitrex would not work for those prolonged severe migraine headaches  She is legally blind,can read large print only,  UPDATE Aug 31 2020: She is accompanied by her mother at today's clinical visit, She had no recurrent seizure, tolerating Keppra xr 750 mg 4 tablets every night  She continue has frequent migraine headaches, often preceded by blurry vision, about once a week, as to be absent from her home schooling, Maxalt dissolvable only provide limited help, felt Imitrex nasal spray works better for her in the past  We personally reviewed MRI of the brain with and without contrast in July 2021, bilateral occipital, parietal occipital cystic encephalomalacia, gliosis, no acute abnormality  EEG was normal in July 2021  UPDATE April 07 2021: She still does home schooling, overall doing very well, like Keppra XR 500 mg 4 tablets every night, had no recurrent seizure for few years, she is dealing with endometriosis, has frequent lower abdominal pain, she also has occasionally migraine, couple times each month, Imitrex works well, but she preferred to go back Maxalt dissolvable over Imitrex subcutaneous injection, felt the benefit has not significant difference  REVIEW OF SYSTEMS: Full 14 system review of systems performed and notable only for as above All other review of systems were negative.  ALLERGIES: Allergies  Allergen  Reactions   Sulfa Antibiotics Hives   Levetiracetam Other (See Comments)    Can not have brand name- [employer]- caused nerve pain     HOME MEDICATIONS: Current Outpatient Medications  Medication Sig Dispense Refill    cetirizine (ZYRTEC) 10 MG tablet TAKE 1 TABLET BY MOUTH EVERY DAY 90 tablet 1   cyclobenzaprine (FEXMID) 7.5 MG tablet Take 1 tablet (7.5 mg total) by mouth at bedtime as needed for muscle spasms. 90 tablet 3   cyclobenzaprine (FLEXERIL) 5 MG tablet Take 1 tablet (5 mg total) by mouth 2 (two) times daily as needed for muscle spasms. 180 tablet 3   drospirenone-ethinyl estradiol (YAZ) 3-0.02 MG tablet Take 1 tablet daily. Discard inactive pills and start a new pack for continuous use. 90 tablet 3   famotidine (PEPCID) 20 MG tablet TAKE 1 TABLET BY MOUTH TWICE A DAY 180 tablet 1   FIBER PO Take 1 tablet by mouth daily.     Levetiracetam 750 MG TB24 Take 4 tablets (3,000 mg total) by mouth daily. Take 4 tablets at night 360 tablet 4   magnesium gluconate (MAGONATE) 500 MG tablet Take 500 mg by mouth daily.     meloxicam (MOBIC) 7.5 MG tablet Take 1 tablet (7.5 mg total) by mouth daily as needed for pain. 60 tablet 5   OVER THE COUNTER MEDICATION Gummy multi vit     OVER THE COUNTER MEDICATION Gummy digestive advantage     PRESCRIPTION MEDICATION Dialstat     prochlorperazine (COMPAZINE) 10 MG tablet Take 1 tablet (10 mg total) by mouth every 8 (eight) hours as needed for nausea or vomiting. 30 tablet 6   SUMAtriptan 6 MG/0.5ML SOAJ Inject 6 mg into the skin as needed. 6 mL 11   topiramate (TOPAMAX) 100 MG tablet Take 1 tablet (100 mg total) by mouth 2 (two) times daily. 180 tablet 4   No current facility-administered medications for this visit.    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"   Cortical visual impairment    Depression    Epilepsy (HCC)    Learning disabilities    global language learning disability   Legally blind    Migraines    Sensory processing difficulty    Short-term memory loss     PAST SURGICAL HISTORY: Past Surgical History:  Procedure Laterality Date    MOUTH SURGERY     WISDOM TOOTH EXTRACTION      FAMILY HISTORY: Family History  Problem Relation Age of Onset   Hyperlipidemia Mother    Allergies Mother    ADD / ADHD Mother    Depression Mother    Arthritis Mother    Alcohol abuse Father    Drug abuse Father    Depression Father    Thyroid disease Father    Allergies Brother    ADD / ADHD Brother    Drug abuse Maternal Grandmother    Diabetes Maternal Grandmother    Bipolar disorder Maternal Grandmother    Hypertension Maternal Grandfather    Hyperlipidemia Maternal Grandfather    Depression Maternal Grandfather    Arthritis Maternal Grandfather    Cancer Paternal Aunt    Cancer Cousin        breast    SOCIAL HISTORY: Social History   Socioeconomic History   Marital status: Single  Spouse name: Not on file   Number of children: 0   Years of education: high school student - home schooled   Highest education level: Not on file  Occupational History   Occupation: Student  Tobacco Use   Smoking status: Never   Smokeless tobacco: Never  Vaping Use   Vaping Use: Never used  Substance and Sexual Activity   Alcohol use: Never   Drug use: Never   Sexual activity: Never  Other Topics Concern   Not on file  Social History Narrative   Lives with her mother and two younger brothers.   Right-handed.   No daily use of caffeine.   Social Determinants of Health   Financial Resource Strain: Not on file  Food Insecurity: Not on file  Transportation Needs: Not on file  Physical Activity: Not on file  Stress: Not on file  Social Connections: Not on file  Intimate Partner Violence: Not on file     PHYSICAL EXAM   Vitals:   04/07/21 1537  BP: 127/85  Pulse: (!) 103  Weight: 186 lb 8 oz (84.6 kg)  Height: 5\' 4"  (1.626 m)   Not recorded     Body mass index is 32.01 kg/m.  PHYSICAL EXAMNIATION:  Gen: NAD, conversant, well nourised, well groomed                     Cardiovascular: Regular rate  rhythm, no peripheral edema, warm, nontender. Eyes: Conjunctivae clear without exudates or hemorrhage Neck: Supple, no carotid bruits. Pulmonary: Clear to auscultation bilaterally   NEUROLOGICAL EXAM:  MENTAL STATUS: Speech/cognition: Awake, alert, follow commands, but difficulty to comprehend medication instruction, rely on her mother to supplement medical history   CRANIAL NERVES: CN II:   Pupils are round equal and briskly reactive to light.  Decreased peripheral visual field, counting read large print, need close up to read medium-size print CN III, IV, VI: extraocular movement are normal. No ptosis. CN V: Facial sensation is intact to light touch CN VII: Face is symmetric with normal eye closure  CN VIII: Hearing is normal to causal conversation. CN IX, X: Phonation is normal. CN XI: Head turning and shoulder shrug are intact  MOTOR: There is no pronator drift of out-stretched arms. Muscle bulk and tone are normal. Muscle strength is normal.  REFLEXES: Reflexes are 2+ and symmetric at the biceps, triceps, knees, and ankles. Plantar responses are flexor.  SENSORY: Intact to light touch, pinprick and vibratory sensation are intact in fingers and toes.  COORDINATION: There is no trunk or limb dysmetria noted.  GAIT/STANCE: Needs push-up to get up from seated position, wide-based, cautious     CC: , FNP  Referring Physician

## 2021-04-20 NOTE — Progress Notes (Signed)
[facility] Return Visit  SUBJECTIVE  History of Present Illness: Claire Stewart is a 20 y.o. female seen in follow-up for pelvic pain/ muscle spasm.  Pain has improved with the flexeril. Getting better sleep. Was taking three times per day, but now takes one 5mg  per day and 7.5mg  at night. Per mother, still has nights where the pain is bothering her. Still has some knots on her right side. This is happening 1-2 times per week rather than every day.   Completed physical therapy and has seen some improvement. Continues to do exercises at home.   Still having breakthrough bleeding on OCPs. Periods are not heavy, only last a few days.   Past Medical History: Patient  has a past medical history of ADHD, Autism, Brain damage, Cortical visual impairment, Depression, Epilepsy (HCC), Learning disabilities, Legally blind, Migraines, Sensory processing difficulty, and Short-term memory loss.   Past Surgical History: She  has a past surgical history that includes Wisdom tooth extraction and Mouth surgery.   Medications: She has a current medication list which includes the following prescription(s): cyclobenzaprine, cetirizine, cyclobenzaprine, drospirenone-ethinyl estradiol, famotidine, fiber, levetiracetam, magnesium gluconate, meloxicam, OVER THE COUNTER MEDICATION, OVER THE COUNTER MEDICATION, PRESCRIPTION MEDICATION, prochlorperazine, rizatriptan, sumatriptan, and topiramate.   Allergies: Patient is allergic to sulfa antibiotics and levetiracetam.   Social History: Patient  reports that she has never smoked. She has never used smokeless tobacco. She reports that she does not drink alcohol and does not use drugs.      OBJECTIVE     Physical Exam: Vitals:   04/21/21 1612  BP: 118/81  Pulse: (!) 108    Gen: No apparent distress, A&O x 3.    ASSESSMENT AND PLAN    [name] is a 20 y.o. with:  1. Muscle spasm     - Increase flexeril to 7.5mg  up to 3 times per day.  She typically only takes it two times.  - Continue with PT exercises at home. Patient unable to perform some of the myofascial massage on certain areas so would like to go back to PT to have this done again. Referral placed.    Follow up 1 year or sooner if needed.   36, MD  Time spent: I spent 25 minutes dedicated to the care of this patient on the date of this encounter to include pre-visit review of records, face-to-face time with the patient and post visit documentation and ordering medication/ testing.

## 2021-04-21 ENCOUNTER — Other Ambulatory Visit: Payer: Self-pay

## 2021-04-21 ENCOUNTER — Encounter: Payer: Self-pay | Admitting: Obstetrics and Gynecology

## 2021-04-21 ENCOUNTER — Ambulatory Visit (INDEPENDENT_AMBULATORY_CARE_PROVIDER_SITE_OTHER): Payer: Managed Care, Other (non HMO) | Admitting: Obstetrics and Gynecology

## 2021-04-21 VITALS — BP 118/81 | HR 108

## 2021-04-21 DIAGNOSIS — M62838 Other muscle spasm: Secondary | ICD-10-CM | POA: Diagnosis not present

## 2021-04-21 MED ORDER — CYCLOBENZAPRINE HCL 7.5 MG PO TABS: 7.5000 mg | ORAL_TABLET | Freq: Three times a day (TID) | ORAL | 11 refills | Status: DC | PRN

## 2021-05-12 ENCOUNTER — Other Ambulatory Visit: Payer: Self-pay | Admitting: Obstetrics and Gynecology

## 2021-05-12 DIAGNOSIS — M62838 Other muscle spasm: Secondary | ICD-10-CM

## 2021-05-13 ENCOUNTER — Other Ambulatory Visit: Payer: Self-pay

## 2021-05-13 ENCOUNTER — Encounter: Payer: Self-pay | Admitting: Physical Therapy

## 2021-05-13 ENCOUNTER — Ambulatory Visit: Payer: Managed Care, Other (non HMO) | Attending: Obstetrics and Gynecology | Admitting: Physical Therapy

## 2021-05-13 DIAGNOSIS — M6281 Muscle weakness (generalized): Secondary | ICD-10-CM | POA: Insufficient documentation

## 2021-05-13 DIAGNOSIS — R252 Cramp and spasm: Secondary | ICD-10-CM | POA: Insufficient documentation

## 2021-05-13 NOTE — Patient Instructions (Signed)
Access Code: [email or web address] URL: [email or web address] Date: 05/13/2021 Prepared by: Raynelle Fanning  Exercises Standing Quadratus Lumborum Stretch with Doorway - 2 x daily - 7 x weekly - 1 sets - 2 reps - 30-60 sec hold Supine Bridge - 2 x daily - 7 x weekly - 1-3 sets - 10 reps - 10 sec hold  Patient Education Journalist, newspaper Posture

## 2021-05-13 NOTE — Therapy (Signed)
[facility] [address] [place], [place], [address] Phone: [phone]   Fax:  [phone]  Physical Therapy Evaluation  Patient Details  Name: Claire Stewart. 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    Subjective Assessment - 05/13/21 0808     Subjective Spine is getting tighter. Sits on bed doing school work sitting cross legged with lap top on lap. Bil sides anteriorly to under ribs. Pain is intermittent and inconsistent. Still has some pain in pelvic area.    Patient is accompained by: Family member   mother   Pertinent History possibly endo, legally blind, sezures, cognitive impairments    Patient Stated Goals know how to manage pain; reduce    Currently in Pain? Yes    Pain Score 2     Pain Location Thoracic    Pain Orientation Right;Left;Lateral    Pain Descriptors / Indicators Stabbing    Pain Type Acute pain    Pain Onset More than a month ago    Pain Frequency Intermittent     Aggravating Factors  lying on right side    Pain Relieving Factors heat    Effect of Pain on Daily Activities affects studying                [facility] PT Assessment - 05/13/21 0001       Assessment   Medical Diagnosis muscles spasm    Referring Provider (PT) Lanetta Inch MD    Onset Date/Surgical Date 02/23/21    Next MD Visit one year    Prior Therapy yes      Balance Screen   Has the patient fallen in the past 6 months No    Has the patient had a decrease in activity level because of a fear of falling?  No    Is the patient reluctant to leave their home because of a fear of falling?  No      Home Nurse, mental health Private residence    Living Arrangements Parent      Posture/Postural Control   Posture/Postural Control Postural limitations    Postural Limitations Rounded Shoulders;Forward head;Increased thoracic kyphosis      ROM / Strength   AROM / PROM / Strength  AROM;Strength      AROM   Overall AROM Comments Lumbar flexion limited by tight HS; Ext and SB WFL; rotation decreased  25% bil      Strength   Overall Strength Comments bil hips grossly 5/5; weakness in right lumbar with MMT of left hip flexors in sitting      Flexibility   Soft Tissue Assessment /Muscle Length yes    Hamstrings marked tightness    Quadriceps WNL      Palpation   Palpation comment right QL and diaphragm                        Objective measurements completed on examination: See above findings.               PT Education - 05/13/21 0841     Education Details HEP; initial postural education for sitting/studying    Person(s) Educated Patient;Parent(s)    Methods Explanation;Demonstration;Handout    Comprehension Verbalized understanding;Returned demonstration              PT Short Term Goals - 05/13/21 1531       PT SHORT TERM GOAL #1   Title ind with initial HEP    Baseline -    Time 4    Period Weeks    Status New     Target Date 06/10/21               PT Long Term Goals - 05/13/21 1531       PT LONG TERM GOAL #1   Title Pt will report decreased pain in bil sides by 75% at least 80% of the time.    Baseline -    Time 8    Period Weeks    Status New    Target Date 07/08/21      PT LONG TERM GOAL #2   Title Pt will be ind with advanced HEP for pain management    Time 8    Period Weeks    Status New      PT LONG TERM GOAL #3   Title Pt will be able to demonstrate and /or verbalize techniques to correct her posture while studying to help prevent further pain.    Baseline -    Time 8    Period Weeks    Status New      PT LONG TERM GOAL #4   Title Pt will demonstrate improved core strength with MMT of bil hip flexors and by demonstrating improved standing posture.    Time 8    Period Weeks    Status New                    Plan - 05/13/21 5188     Clinical Impression Statement Patient presents with c/o of bil side pain since mid June 2022. She was discharged from this clinic for pelvic pain at the beginning of June and this pain is still being managed with her HEP. Patient reports pain when sitting doing her school work which she does mainly while sitting on her bed. She is visually impaired/legally blind and has to connect her computer to the large TV on the wall in order to see her lessons. Posturally she sits with crossed legs with a kyphotic spine and forward head. Her mother reports her standing posture is poor as well. She has tenderness in her right QL and is somewhat tight in right lateral diaphragm. Bil  HS are very tight. She also demonstrates weakness in her core with MMT of hips. She will benefit from skilled PT to address these deficits.    Personal Factors and Comorbidities Comorbidity 3+    Comorbidities possibly endo, legally blind, sezures, cognitive impairments    Stability/Clinical Decision Making Evolving/Moderate complexity    Clinical Decision Making Low     Rehab Potential Excellent    PT Frequency 2x / week    PT Duration 8 weeks    PT Treatment/Interventions ADLs/Self Care Home Management;Moist Heat;Therapeutic exercise;Neuromuscular re-education;Patient/family education;Manual techniques;Taping    PT Next Visit Plan begin postural and core strengthening, right QL release, progress HEP    PT Home Exercise Plan [email or web address]    Consulted and Agree with Plan of Care Patient;Family member/caregiver    Family Member Consulted mother             Patient will benefit from skilled therapeutic intervention in order to improve the following deficits and impairments:  Decreased range of motion, Impaired flexibility, Pain, Increased muscle spasms, Postural dysfunction, Decreased strength  Visit Diagnosis: Cramp and spasm - Plan: PT plan of care cert/re-cert  Muscle weakness (generalized) - Plan: PT plan of care cert/re-cert     Problem List Patient Active Problem List   Diagnosis Date Noted   Seizure disorder (HCC) 08/31/2020   Development delay 08/31/2020   Chronic migraine w/o aura w/o status migrainosus, not intractable 08/31/2020   Abnormal brain MRI 08/31/2020   Generalized idiopathic epilepsy and epileptic syndromes, not intractable, with status epilepticus (HCC) 02/27/2020   Development disorder, language 02/27/2020   Chronic migraine 02/27/2020   Brain damage    Epilepsy (HCC)    Learning disabilities    Migraines    Sensory processing difficulty    Short-term memory loss    Cortical visual impairment    Solon Palm, PT 05/13/21 3:40 PM  [facility] [address], [address] [place], [place], [address] Phone: [phone]   Fax:  [phone]  Name: Rakiya Krawczyk. Bussa MRN: [id number] Date of Birth: 2001/04/08

## 2021-05-19 ENCOUNTER — Encounter: Payer: Self-pay | Admitting: Physical Therapy

## 2021-05-19 ENCOUNTER — Ambulatory Visit: Payer: Managed Care, Other (non HMO) | Admitting: Physical Therapy

## 2021-05-19 ENCOUNTER — Other Ambulatory Visit: Payer: Self-pay

## 2021-05-19 DIAGNOSIS — M6281 Muscle weakness (generalized): Secondary | ICD-10-CM | POA: Diagnosis not present

## 2021-05-19 DIAGNOSIS — R252 Cramp and spasm: Secondary | ICD-10-CM | POA: Diagnosis not present

## 2021-05-19 NOTE — Therapy (Signed)
[facility] [address] [place], [place], [address] Phone: [phone]   Fax:  [phone]  Physical Therapy Treatment  Patient Details  Name: Claire Stewart MRN: [id number] Date of Birth: 2001/02/11 Referring Provider (PT): Lanetta Inch MD   Encounter Date: 05/19/2021   PT End of Session - 05/19/21 1442     Visit Number 2    Date for PT Re-Evaluation 07/08/21    Authorization Type cigna managed/MCD secondary    PT Start Time 1400    PT Stop Time 1440    PT Time Calculation (min) 40 min    Activity Tolerance Patient tolerated treatment well    Behavior During Therapy WFL for tasks assessed/performed             Past Medical History:  Diagnosis Date   ADHD    Autism    mildly   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   Subjective Assessment - 05/19/21 1407     Subjective I have been doing my stretches and change where I do my school work.    Patient is accompained by: Family member   mother   Pertinent History possibly endo, legally blind, sezures, cognitive impairments    Patient Stated Goals know how to manage pain; reduce    Currently in Pain? Yes    Pain Score 2     Pain Location Thoracic    Pain Orientation Right;Left    Pain Descriptors / Indicators Stabbing    Pain Type Acute pain    Pain Onset More than a month ago    Pain Frequency Intermittent    Aggravating Factors  lying on right side    Pain Relieving Factors heat    Effect of Pain on Daily Activities affects studying    Multiple Pain Sites  No                               [facility] Adult PT Treatment/Exercise - 05/19/21 0001       Lumbar Exercises: Sidelying   Other Sidelying Lumbar Exercises open book on the left side with assisting the right lower rib cage to open up 15x      Manual Therapy   Manual Therapy Soft tissue mobilization;Joint mobilization    Joint Mobilization gapping of T9-L1 in sidely    Soft tissue mobilization manual work to the right quadratus, obliques, and gluteus medius and TFL in left sidely while monitoring for pain; then manual work to the left quadratus, between the left lower rib cage, along the quadratus, and on the sides of T8-L1, and the obliques                  Upper Extremity Functional Index Score :   /80     PT Short Term Goals - 05/13/21 1531       PT SHORT TERM GOAL #1   Title  ind with initial HEP    Baseline -    Time 4    Period Weeks    Status New    Target Date 06/10/21               PT Long Term Goals - 05/13/21 1531       PT LONG TERM GOAL #1   Title Pt will report decreased pain in bil sides by 75% at least 80% of the time.    Baseline -    Time 8    Period Weeks    Status New    Target Date 07/08/21      PT LONG TERM GOAL #2   Title Pt will be ind with advanced HEP for pain management    Time 8    Period Weeks    Status New      PT LONG TERM GOAL #3   Title Pt will be able to demonstrate and /or verbalize techniques to correct her posture while studying to help prevent further pain.    Baseline -    Time 8    Period Weeks    Status New      PT LONG TERM GOAL #4   Title Pt will demonstrate improved core strength with MMT of bil hip flexors and by demonstrating improved standing posture.    Time 8    Period Weeks    Status New                   Plan - 05/19/21 1408     Clinical Impression Statement Patient has trigger points in the quadratus, along the intercoastals of the lower rib cage, and obliques  and right gluteus medius. After therapy, patient had no pain and able to move with ease. Patient is doing her HEP consistently. She has changed her area of study and it has helped with her pain. Patient is able to see right in front of her but has no peripheral vision. Patient mom is present to help with memory due to difficulty with short term memory. Patient will benefit from skilled therapy to address her core weakness, hip weakness and reducing trigger points.    Personal Factors and Comorbidities Comorbidity 3+    Comorbidities possibly endo, legally blind, sezures, cognitive impairments    Stability/Clinical Decision Making Evolving/Moderate complexity    Rehab Potential Excellent    PT Frequency 2x / week    PT Duration 8 weeks    PT Treatment/Interventions ADLs/Self Care Home Management;Moist Heat;Therapeutic exercise;Neuromuscular re-education;Patient/family education;Manual techniques;Taping    PT Next Visit Plan stretches for the quadratus and hiph flexors; core strength basic with engaging the upper and lower abdominals    PT Home Exercise Plan [email or web address]    Consulted and Agree with Plan of Care Patient;Family member/caregiver    Family Member Consulted mother             Patient will benefit from skilled therapeutic intervention in order to improve the following deficits and impairments:  Decreased range of motion, Impaired flexibility, Pain, Increased muscle spasms, Postural dysfunction, Decreased strength  Visit Diagnosis: Cramp and spasm  Muscle weakness (generalized)     Problem List Patient Active Problem List   Diagnosis Date Noted   Seizure disorder (HCC) 08/31/2020   Development delay 08/31/2020   Chronic migraine w/o aura w/o status migrainosus, not intractable 08/31/2020   Abnormal brain MRI 08/31/2020   Generalized idiopathic epilepsy and epileptic syndromes, not intractable, with status epilepticus (HCC)  02/27/2020   Development disorder, language  02/27/2020   Chronic migraine 02/27/2020   Brain damage    Epilepsy (HCC)    Learning disabilities    Migraines    Sensory processing difficulty    Short-term memory loss    Cortical visual impairment     Eulis Foster, PT 05/19/21 2:46 PM  [facility] [address] [place], [place], [address] Phone: [phone]   Fax:  [phone]  Name: Claire Stewart MRN: [id number] Date of Birth: 2001-06-08

## 2021-05-20 ENCOUNTER — Ambulatory Visit: Payer: Managed Care, Other (non HMO) | Admitting: Physical Therapy

## 2021-05-20 DIAGNOSIS — R252 Cramp and spasm: Secondary | ICD-10-CM

## 2021-05-20 DIAGNOSIS — M6281 Muscle weakness (generalized): Secondary | ICD-10-CM

## 2021-05-20 NOTE — Patient Instructions (Signed)
Access Code: [email or web address] URL: [email or web address] Date: 05/20/2021 Prepared by: Lavinia Sharps  Exercises Supine Bridge - 2 x daily - 7 x weekly - 1-3 sets - 10 reps - 10 sec hold Child's Pose with Sidebending - 1 x daily - 7 x weekly - 1 sets - 3 reps Cat Cow to Child's Pose - 1 x daily - 7 x weekly - 1 sets - 5 reps Standing Quadratus Lumborum Stretch with Doorway - 2 x daily - 7 x weekly - 1 sets - 2 reps - 30-60 sec hold Standing High Row with Resistance - 1 x daily - 7 x weekly - 1 sets - 10 reps Standing Shoulder Extension with Resistance - 1 x daily - 7 x weekly - 1 sets - 10 reps  Patient Education Forward Head Posture Office Posture

## 2021-05-20 NOTE — Therapy (Signed)
[facility] [address], [address] [place], [place], [address] Phone: [phone]   Fax:  [phone]  Physical Therapy Treatment  Patient Details  Name: Claire Stewart. 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   Subjective Assessment - 05/20/21 1446     Subjective It really loosened up my sides last time.  My mother wants me to keep a log on my Ipad.    Patient is accompained by: Family member   mother   Pertinent History possibly endo, legally blind, sezures, cognitive impairments    Currently in Pain? Yes    Pain Score 1     Pain Location Thoracic    Pain Orientation Right;Left    Pain Type Acute pain                               [facility] Adult PT Treatment/Exercise - 05/20/21 0001       Lumbar Exercises: Stretches   Other Lumbar Stretch Exercise doorway stretch: staggered stance 5x, arm slides 5x; "rainbow" UE up and over 5x right/left      Lumbar Exercises: Sidelying   Other Sidelying Lumbar  Exercises open books 10x right/left      Lumbar Exercises: Quadruped   Madcat/Old Horse 10 reps    Other Quadruped Lumbar Exercises childs pose with bias arms right/left 3x each way      Shoulder Exercises: Standing   Extension Both;10 reps;Theraband    Theraband Level (Shoulder Extension) Level 3 (Green)    Extension Limitations anchored high on door    Row Both;10 reps;Theraband    Theraband Level (Shoulder Row) Level 3 (Green)    Row Limitations low rows    Other Standing Exercises green band anchored high on door pull downs 10x                     PT Education - 05/20/21 1536     Education Details cat/cow; child pose with bias; doorway stretch; green band rows and shoulder extensions    Person(s) Educated Patient;Parent(s)    Methods Explanation;Demonstration;Handout    Comprehension Returned demonstration;Verbalized understanding              PT Short Term Goals -  05/13/21 1531       PT SHORT TERM GOAL #1   Title ind with initial HEP    Baseline -    Time 4    Period Weeks    Status New    Target Date 06/10/21               PT Long Term Goals - 05/13/21 1531       PT LONG TERM GOAL #1   Title Pt will report decreased pain in bil sides by 75% at least 80% of the time.    Baseline -    Time 8    Period Weeks    Status New    Target Date 07/08/21      PT LONG TERM GOAL #2   Title Pt will be ind with advanced HEP for pain management    Time 8    Period Weeks    Status New      PT LONG TERM GOAL #3   Title Pt will be able to demonstrate and /or verbalize techniques to correct her posture while studying to help prevent further pain.    Baseline -    Time 8    Period Weeks    Status New      PT LONG TERM GOAL #4   Title Pt will demonstrate improved core strength with MMT of bil hip flexors and by demonstrating improved standing posture.    Time 8    Period Weeks    Status New                   Plan - 05/20/21 1518      Clinical Impression Statement The patient returns 1 day since last visit and treatment focus on myofascial release/manual therapy for quadratus, oblique and intercostal release.  Today, we focused on active exercise and progressing her home ex routine.  Added additional mobility ex's and initiated trunk/scapular strengthening ex's with the band.  She was given Medbridge digital HEP access instructions so she could enlarge images as needed in addition to paper handouts.  The patient's mother is in attendence due to patient's short term memory deficits.  The patient does not complain of pain throughout session.  Verbal cues provided by PT to encourage activation of posterior muscles and to lengthen shortened QL and hip flexors.    Comorbidities possibly endo, legally blind, sezures, cognitive impairments    Rehab Potential Excellent    PT Frequency 2x / week    PT Duration 8 weeks    PT Treatment/Interventions ADLs/Self Care Home Management;Moist Heat;Therapeutic exercise;Neuromuscular re-education;Patient/family education;Manual techniques;Taping    PT Next Visit Plan core strength basic with engaging the upper and lower abdominals;  manual therapy as needed;  QL and hip flexor muscle lengthening    PT Home Exercise Plan [email or web address]      Consulted and Agree with Plan of Care Patient;Family member/caregiver    Family Member Consulted mother             Patient will benefit from skilled therapeutic intervention in order to improve the following deficits and impairments:  Decreased range of motion, Impaired flexibility, Pain, Increased muscle spasms, Postural dysfunction, Decreased strength  Visit Diagnosis: Cramp and spasm  Muscle weakness (generalized)     Problem List Patient Active Problem List   Diagnosis Date Noted   Seizure disorder (HCC) 08/31/2020   Development delay 08/31/2020   Chronic migraine w/o aura w/o status migrainosus, not intractable 08/31/2020  Abnormal brain MRI  08/31/2020   Generalized idiopathic epilepsy and epileptic syndromes, not intractable, with status epilepticus (HCC) 02/27/2020   Development disorder, language 02/27/2020   Chronic migraine 02/27/2020   Brain damage    Epilepsy (HCC)    Learning disabilities    Migraines    Sensory processing difficulty    Short-term memory loss    Cortical visual impairment    Lavinia Sharps, PT 05/20/21 3:47 PM Phone: [phone] Fax: [phone]  Vivien Presto, PT 05/20/2021, 3:46 PM  [facility] [address] [place], [place], [address] Phone: [phone]   Fax:  [phone]  Name: Reiana Poteet. Garno MRN: [id number] Date of Birth: Jul 01, 2001

## 2021-05-25 ENCOUNTER — Ambulatory Visit (INDEPENDENT_AMBULATORY_CARE_PROVIDER_SITE_OTHER): Payer: Managed Care, Other (non HMO) | Admitting: Family Medicine

## 2021-05-25 ENCOUNTER — Encounter: Payer: Self-pay | Admitting: Family Medicine

## 2021-05-25 ENCOUNTER — Other Ambulatory Visit: Payer: Self-pay

## 2021-05-25 VITALS — BP 117/80 | HR 111 | Temp 98.2°F | Ht 64.0 in | Wt 190.4 lb

## 2021-05-25 DIAGNOSIS — Z0001 Encounter for general adult medical examination with abnormal findings: Secondary | ICD-10-CM | POA: Diagnosis not present

## 2021-05-25 DIAGNOSIS — Z5320 Procedure and treatment not carried out because of patient's decision for unspecified reasons: Secondary | ICD-10-CM | POA: Diagnosis not present

## 2021-05-25 DIAGNOSIS — K219 Gastro-esophageal reflux disease without esophagitis: Secondary | ICD-10-CM | POA: Diagnosis not present

## 2021-05-25 DIAGNOSIS — M791 Myalgia, unspecified site: Secondary | ICD-10-CM | POA: Diagnosis not present

## 2021-05-25 DIAGNOSIS — Z0000 Encounter for general adult medical examination without abnormal findings: Secondary | ICD-10-CM

## 2021-05-25 DIAGNOSIS — Z23 Encounter for immunization: Secondary | ICD-10-CM

## 2021-05-25 DIAGNOSIS — R102 Pelvic and perineal pain: Secondary | ICD-10-CM

## 2021-05-25 MED ORDER — FAMOTIDINE 20 MG PO TABS: 20.0000 mg | ORAL_TABLET | Freq: Two times a day (BID) | ORAL | 1 refills | Status: DC

## 2021-05-25 NOTE — Progress Notes (Signed)
Assessment & Plan:  1. Well adult exam Preventive health education provided. - CBC with Differential/Platelet - CMP14+EGFR - Lipid panel - TSH  2. Pap smear of cervix declined Patient is not sexually active. She also has a history of developmental delay and sensory processing difficulty. After discussion with patient and her mother regarding risks it was decided not to proceed with a pap smear at this time.  3. Gastroesophageal reflux disease, unspecified whether esophagitis present Well controlled on current regimen.  - famotidine (PEPCID) 20 MG tablet; Take 1 tablet (20 mg total) by mouth 2 (two) times daily.  Dispense: 180 tablet; Refill: 1 - CMP14+EGFR  4. Muscle pain - Ambulatory referral to Rheumatology  5. Pelvic pain in female Well controlled on current regimen.   6. Immunization due - Tdap vaccine greater than or equal to 7yo IM - given in office.   Follow-up: Return in about 1 year (around 05/25/2022) for annual physical.   Hendricks Limes, MSN, APRN, FNP-C [facility]  Subjective:  Patient ID: Smita Lesh. [place]uya, female    DOB: 03-Jun-2001  Age: 20 y.o. MRN: [id number]  Patient Care Team: Loman Brooklyn, FNP as PCP - General (Family Medicine)   CC:  Chief Complaint  Patient presents with   Annual Exam   Cyst    Bilateral sides x 1-1 1/2 months     HPI Jaydalynn D. Bogie presents for her annual physical. She is accompanied by her mother.  Occupation: Homeschooled by her mother, Marital status: Single, Substance use: None Last pap smear: Never - not sexually active Hepatitis C Screening: Declined Immunizations: Flu Vaccine: declined Tdap Vaccine:  agreeable in getting today   COVID-19 Vaccine: declined  DEPRESSION SCREENING PHQ 2/9 Scores 05/25/2021 01/09/2020  PHQ - 2 Score 0 0  PHQ- 9 Score 3 -     Patient reports knots in the muscles on her sides that are painful. They started out in her lower abdomen, which were noted by her  urogynecologist. The abdominal ones have resolved, but now they are in different places. She is currently getting deep tissue massages through physical therapy and doing lots of stretching at home. It was mentioned that she should see a rheumatologist (?fibromyalgia) and she is interested in pursuing this.   Pelvic pain from suspected endometriosis is controlled with oral birth control and Flexeril. Her prescription allows her to take this TID, but generally she only takes it BID.   Review of Systems  Constitutional:  Negative for chills, fever, malaise/fatigue and weight loss.  HENT:  Negative for congestion, ear discharge, ear pain, nosebleeds, sinus pain, sore throat and tinnitus.   Eyes:  Negative for blurred vision, double vision, pain, discharge and redness.  Respiratory:  Negative for cough, shortness of breath and wheezing.   Cardiovascular:  Negative for chest pain, palpitations and leg swelling.  Gastrointestinal:  Negative for abdominal pain, constipation, diarrhea, heartburn, nausea and vomiting.  Genitourinary:  Negative for dysuria, frequency and urgency.  Musculoskeletal:  Positive for myalgias.  Skin:  Negative for rash.  Neurological:  Negative for dizziness, seizures, weakness and headaches.  Psychiatric/Behavioral:  Negative for depression, substance abuse and suicidal ideas. The patient is not nervous/anxious.     Current Outpatient Medications:    cetirizine (ZYRTEC) 10 MG tablet, TAKE 1 TABLET BY MOUTH EVERY DAY, Disp: 90 tablet, Rfl: 1   cyclobenzaprine (FEXMID) 7.5 MG tablet, Take 1 tablet (7.5 mg total) by mouth at bedtime as needed for muscle spasms., Disp:  90 tablet, Rfl: 3   cyclobenzaprine (FEXMID) 7.5 MG tablet, TAKE 1 TABLET (7.5 MG TOTAL) BY MOUTH 3 (THREE) TIMES DAILY AS NEEDED FOR MUSCLE SPASMS., Disp: 90 tablet, Rfl: 11   drospirenone-ethinyl estradiol (YAZ) 3-0.02 MG tablet, Take 1 tablet daily. Discard inactive pills and start a new pack for continuous use.,  Disp: 90 tablet, Rfl: 3   famotidine (PEPCID) 20 MG tablet, TAKE 1 TABLET BY MOUTH TWICE A DAY, Disp: 180 tablet, Rfl: 1   FIBER PO, Take 1 tablet by mouth daily., Disp: , Rfl:    Levetiracetam 750 MG TB24, Take 4 tablets (3,000 mg total) by mouth daily. Take 4 tablets at night, Disp: 360 tablet, Rfl: 4   magnesium gluconate (MAGONATE) 500 MG tablet, Take 500 mg by mouth daily., Disp: , Rfl:    meloxicam (MOBIC) 7.5 MG tablet, Take 1 tablet (7.5 mg total) by mouth daily as needed for pain., Disp: 60 tablet, Rfl: 5   OVER THE COUNTER MEDICATION, Gummy multi vit, Disp: , Rfl:    OVER THE COUNTER MEDICATION, Gummy digestive advantage, Disp: , Rfl:    PRESCRIPTION MEDICATION, Dialstat, Disp: , Rfl:    prochlorperazine (COMPAZINE) 10 MG tablet, Take 1 tablet (10 mg total) by mouth every 8 (eight) hours as needed for nausea or vomiting., Disp: 30 tablet, Rfl: 6   rizatriptan (MAXALT-MLT) 10 MG disintegrating tablet, Take 1 tablet (10 mg total) by mouth as needed. 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"   Cortical visual impairment    Depression    Epilepsy ([place])    Learning disabilities    global language learning disability   Legally blind    Migraines    Sensory processing difficulty    Short-term memory loss     Past Surgical History:  Procedure Laterality Date   MOUTH SURGERY     WISDOM TOOTH EXTRACTION      Family History  Problem Relation Age of Onset    Hyperlipidemia Mother    Allergies Mother    ADD / ADHD Mother    Depression Mother    Arthritis Mother    Alcohol abuse Father    Drug abuse Father    Depression Father    Thyroid disease Father    Allergies Brother    ADD / ADHD Brother    Drug abuse Maternal Grandmother    Diabetes Maternal Grandmother    Bipolar disorder Maternal Grandmother    Hypertension Maternal Grandfather    Hyperlipidemia Maternal Grandfather    Depression Maternal Grandfather    Arthritis Maternal Grandfather    Cancer Paternal Aunt    Cancer Cousin        breast    Social History   Socioeconomic History   Marital status: Single    Spouse name: Not on file   Number of  children: 0   Years of education: high school student - home schooled   Highest education level: Not on file  Occupational History   Occupation: Student  Tobacco Use   Smoking status: Never   Smokeless tobacco: Never  Vaping Use   Vaping Use: Never used  Substance and Sexual Activity   Alcohol use: Never   Drug use: Never   Sexual activity: Never  Other Topics Concern   Not on file  Social History Narrative   Lives with her mother and two younger brothers.   Right-handed.   No daily use of caffeine.   Social Determinants of Health   Financial Resource Strain: Not on file  Food Insecurity: Not on file  Transportation Needs: Not on file  Physical Activity: Not on file  Stress: Not on file  Social Connections: Not on file  Intimate Partner Violence: Not on file      Objective:    BP 117/80   Pulse (!) 111   Temp 98.2 F (36.8 C) (Temporal)   Ht 5' 4"  (1.626 m)   Wt 190 lb 6.4 oz (86.4 kg)   SpO2 95%   BMI 32.68 kg/m   Wt Readings from Last 3 Encounters:  05/25/21 190 lb 6.4 oz (86.4 kg)  04/07/21 186 lb 8 oz (84.6 kg)  11/25/20 175 lb (79.4 kg)    Physical Exam Vitals reviewed. Exam conducted with a chaperone present.  Constitutional:      General: She is not in acute distress.    Appearance:  Normal appearance. She is obese. She is not ill-appearing, toxic-appearing or diaphoretic.  HENT:     Head: Normocephalic and atraumatic.     Right Ear: Tympanic membrane, ear canal and external ear normal. There is no impacted cerumen.     Left Ear: Tympanic membrane, ear canal and external ear normal. There is no impacted cerumen.     Nose: Nose normal. No congestion or rhinorrhea.     Mouth/Throat:     Mouth: Mucous membranes are moist.     Pharynx: Oropharynx is clear. No oropharyngeal exudate or posterior oropharyngeal erythema.  Eyes:     General: No scleral icterus.       Right eye: No discharge.        Left eye: No discharge.     Conjunctiva/sclera: Conjunctivae normal.     Pupils: Pupils are equal, round, and reactive to light.  Cardiovascular:     Rate and Rhythm: Normal rate and regular rhythm.     Heart sounds: Normal heart sounds. No murmur heard.   No friction rub. No gallop.  Pulmonary:     Effort: Pulmonary effort is normal. No respiratory distress.     Breath sounds: Normal breath sounds. No stridor. No wheezing, rhonchi or rales.  Chest:     Chest wall: No mass, lacerations, deformity, swelling, tenderness, crepitus or edema.  Breasts:    Breasts are symmetrical.     Right: Normal.     Left: Normal.  Abdominal:     General: Abdomen is flat. Bowel sounds are normal. There is no distension.     Palpations: Abdomen is soft. There is no hepatomegaly, splenomegaly or mass.     Tenderness: There is no abdominal tenderness. There is no guarding or rebound.     Hernia: No hernia is present.  Musculoskeletal:        General: Normal range of motion.     Cervical back: Normal range of motion and neck supple.  No rigidity. No muscular tenderness.  Lymphadenopathy:     Cervical: No cervical adenopathy.     Upper Body:     Right upper body: No supraclavicular, axillary or pectoral adenopathy.     Left upper body: No supraclavicular, axillary or pectoral adenopathy.   Skin:    General: Skin is warm and dry.     Capillary Refill: Capillary refill takes less than 2 seconds.  Neurological:     General: No focal deficit present.     Mental Status: She is alert and oriented to person, place, and time. Mental status is at baseline.  Psychiatric:        Mood and Affect: Mood normal.        Behavior: Behavior normal.        Thought Content: Thought content normal.        Judgment: Judgment normal.    Lab Results  Component Value Date   TSH 3.730 02/27/2020   Lab Results  Component Value Date   WBC 6.1 02/27/2020   HGB 15.2 02/27/2020   HCT 44.3 02/27/2020   MCV 92 02/27/2020   Lab Results  Component Value Date   NA 141 02/27/2020   K 4.5 02/27/2020   CO2 19 (L) 02/27/2020   GLUCOSE 93 02/27/2020   BUN 8 02/27/2020   CREATININE 0.82 02/27/2020   BILITOT 0.3 02/27/2020   ALKPHOS 99 02/27/2020   AST 14 02/27/2020   ALT 11 02/27/2020   PROT 7.5 02/27/2020   ALBUMIN 4.3 02/27/2020   CALCIUM 9.6 02/27/2020   No results found for: CHOL No results found for: HDL No results found for: LDLCALC No results found for: TRIG No results found for: CHOLHDL No results found for: HGBA1C

## 2021-05-26 ENCOUNTER — Ambulatory Visit: Payer: Managed Care, Other (non HMO) | Admitting: Physical Therapy

## 2021-05-26 ENCOUNTER — Encounter: Payer: Self-pay | Admitting: Family Medicine

## 2021-05-26 ENCOUNTER — Encounter: Payer: Self-pay | Admitting: Physical Therapy

## 2021-05-26 DIAGNOSIS — E782 Mixed hyperlipidemia: Secondary | ICD-10-CM | POA: Insufficient documentation

## 2021-05-26 DIAGNOSIS — R7989 Other specified abnormal findings of blood chemistry: Secondary | ICD-10-CM

## 2021-05-26 DIAGNOSIS — R252 Cramp and spasm: Secondary | ICD-10-CM

## 2021-05-26 DIAGNOSIS — M6281 Muscle weakness (generalized): Secondary | ICD-10-CM | POA: Diagnosis not present

## 2021-05-26 HISTORY — DX: Mixed hyperlipidemia: E78.2

## 2021-05-26 HISTORY — DX: Other specified abnormal findings of blood chemistry: R79.89

## 2021-05-26 LAB — CMP14+EGFR
ALT: 13 IU/L (ref 0–32)
AST: 18 IU/L (ref 0–40)
Albumin/Globulin Ratio: 1.5 (ref 1.2–2.2)
Albumin: 4.6 g/dL (ref 3.9–5.0)
Alkaline Phosphatase: 106 IU/L (ref 44–121)
BUN/Creatinine Ratio: 9 (ref 9–23)
BUN: 7 mg/dL (ref 6–20)
Bilirubin Total: 0.2 mg/dL (ref 0.0–1.2)
CO2: 17 mmol/L — ABNORMAL LOW (ref 20–29)
Calcium: 9.5 mg/dL (ref 8.7–10.2)
Chloride: 104 mmol/L (ref 96–106)
Creatinine, Ser: 0.8 mg/dL (ref 0.57–1.00)
Globulin, Total: 3 g/dL (ref 1.5–4.5)
Glucose: 88 mg/dL (ref 65–99)
Potassium: 4 mmol/L (ref 3.5–5.2)
Sodium: 137 mmol/L (ref 134–144)
Total Protein: 7.6 g/dL (ref 6.0–8.5)
eGFR: 107 mL/min/{1.73_m2} (ref >59)

## 2021-05-26 LAB — CBC WITH DIFFERENTIAL/PLATELET
Basophils Absolute: 0.1 10*3/uL (ref 0.0–0.2)
Basos: 1 %
EOS (ABSOLUTE): 0.2 10*3/uL (ref 0.0–0.4)
Eos: 2 %
Hematocrit: 44.7 % (ref 34.0–46.6)
Hemoglobin: 14.5 g/dL (ref 11.1–15.9)
Immature Grans (Abs): 0 10*3/uL (ref 0.0–0.1)
Immature Granulocytes: 0 %
Lymphocytes Absolute: 3.1 10*3/uL (ref 0.7–3.1)
Lymphs: 34 %
MCH: 30.4 pg (ref 26.6–33.0)
MCHC: 32.4 g/dL (ref 31.5–35.7)
MCV: 94 fL (ref 79–97)
Monocytes Absolute: 0.6 10*3/uL (ref 0.1–0.9)
Monocytes: 6 %
Neutrophils Absolute: 5.1 10*3/uL (ref 1.4–7.0)
Neutrophils: 57 %
Platelets: 318 10*3/uL (ref 150–450)
RBC: 4.77 x10E6/uL (ref 3.77–5.28)
RDW: 11.7 % (ref 11.7–15.4)
WBC: 9 10*3/uL (ref 3.4–10.8)

## 2021-05-26 LAB — LIPID PANEL
Chol/HDL Ratio: 3.7 ratio (ref 0.0–4.4)
Cholesterol, Total: 271 mg/dL — ABNORMAL HIGH (ref 100–199)
HDL: 74 mg/dL (ref >39)
LDL Chol Calc (NIH): 162 mg/dL — ABNORMAL HIGH (ref 0–99)
Triglycerides: 193 mg/dL — ABNORMAL HIGH (ref 0–149)
VLDL Cholesterol Cal: 35 mg/dL (ref 5–40)

## 2021-05-26 LAB — TSH: TSH: 6.09 u[IU]/mL — ABNORMAL HIGH (ref 0.450–4.500)

## 2021-05-26 NOTE — Therapy (Signed)
[facility] [address], [address] [place], [place], [address] Phone: [phone]   Fax:  [phone]  Physical Therapy Treatment  Patient Details  Name: Claire Stewart MRN: [id number] Date of Birth: 09/18/2000 Referring Provider (PT): Lanetta Inch MD   Encounter Date: 05/26/2021   PT End of Session - 05/26/21 0931     Visit Number 4    Date for PT Re-Evaluation 07/08/21    Authorization Type cigna managed/MCD secondary  [employer] 16 visits 9/7-10/27    Authorization - Visit Number 4    Authorization - Number of Visits 16    Progress Note Due on Visit 10    PT Start Time 0847    PT Stop Time 0928    PT Time Calculation (min) 41 min    Activity Tolerance Patient tolerated treatment well    Behavior During Therapy [facility] for tasks assessed/performed             Past Medical History:  Diagnosis Date   ADHD    Autism    mildly   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   Subjective Assessment - 05/26/21 0851     Subjective the stretches do help in combination with my muscle relaxers.  I have had some increased pain but then I stretch and it helps somewhat. I combined some of the stretches to get more release.    Pertinent History possibly endo, legally blind, sezures, cognitive impairments    Patient Stated Goals know how to manage pain; reduce    Currently in Pain? Yes    Pain Score 3     Pain Location Back    Pain Orientation Right;Left    Pain Type Acute pain     Pain Onset More than a month ago    Pain Frequency Intermittent    Aggravating Factors  lying on Rt side    Pain Relieving Factors heat    Effect of Pain on Daily Activities affects studying                               [facility] Adult PT Treatment/Exercise - 05/26/21 0001       Self-Care   Self-Care Other Self-Care Comments    Other Self-Care Comments  intro of deep core and its purpose and how weakness can cause other muscles to substitute and contribute to pain      Neuro Re-ed    Neuro Re-ed Details  SL and quadruped TA 5x5" in each position, reinforced in standing, encouraged to use throughout day in variety of postures      Lumbar Exercises: Stretches   Other Lumbar Stretch Exercise child's pose hands on foam roller neutral and with bil SB x 10 sec each way    Other Lumbar Stretch Exercise Pt kneeling in quadupred with arm reach across to mat  table for quadruped SB with lat stretch emphasis (Pt demo from what she made up at home that helps)      Lumbar Exercises: Sidelying   Other Sidelying Lumbar Exercises open books 10x right/left      Manual Therapy   Manual Therapy Soft tissue mobilization    Soft tissue mobilization manual work to the right quadratus, obliques, and thoracic and lumbar paraspinals in bil SL                       PT Short Term Goals - 05/13/21 1531       PT SHORT TERM GOAL #1   Title ind with initial HEP    Baseline -    Time 4    Period Weeks    Status New    Target Date 06/10/21               PT Long Term Goals - 05/13/21 1531       PT LONG TERM GOAL #1   Title Pt will report decreased pain in bil sides by 75% at least 80% of the time.    Baseline -    Time 8    Period Weeks    Status New    Target Date 07/08/21      PT LONG TERM GOAL #2   Title Pt will be ind with advanced HEP for pain management    Time 8    Period Weeks    Status New      PT LONG TERM GOAL #3   Title Pt will be able to  demonstrate and /or verbalize techniques to correct her posture while studying to help prevent further pain.    Baseline -    Time 8    Period Weeks    Status New      PT LONG TERM GOAL #4   Title Pt will demonstrate improved core strength with MMT of bil hip flexors and by demonstrating improved standing posture.    Time 8    Period Weeks    Status New                   Plan - 05/26/21 1402     Clinical Impression Statement Pt reports massage helped but then tightness and pain returned.  The stretches she was given last time do seem to help and Pt demo'd a combined stretch in quadruped on floor with contralateral reach up to mat table with child's pose which seems to better target a release through her lats and obliques.  She continues to have tightness and tenderness related to muscle tension on Rt>Lt thoracic paraspinals and Rt QL.  She is tender in upper back and intrascapular region but does not appear to have tension or TPs here so likely secondary to weakness of postural stabilizers of scapulae.  PT introduced TA activation in quadruped and in SL 5x5" today with good recruitment by Pt.  PT discussed need for further core and postural strength to interrupt pain cycle secondary to weakness of these muscle groups.  Will progress this next visit.  Good tolerance of STM today.  Continue along POC.    Comorbidities possibly endo, legally blind, sezures, cognitive impairments    PT Next Visit Plan core strength basic with engaging the upper and lower abdominals;  manual therapy as needed;  QL and hip flexor muscle lengthening    PT Home Exercise Plan [id number]    Consulted and  Agree with Plan of Care Patient    Family Member Consulted mother             Patient will benefit from skilled therapeutic intervention in order to improve the following deficits and impairments:     Visit Diagnosis: Muscle weakness (generalized)  Cramp and spasm     Problem List Patient Active  Problem List   Diagnosis Date Noted   Mixed hyperlipidemia 05/26/2021   Elevated TSH 05/26/2021   Seizure disorder (HCC) 08/31/2020   Development delay 08/31/2020   Chronic migraine w/o aura w/o status migrainosus, not intractable 08/31/2020   Abnormal brain MRI 08/31/2020   Generalized idiopathic epilepsy and epileptic syndromes, not intractable, with status epilepticus (HCC) 02/27/2020   Development disorder, language 02/27/2020   Chronic migraine 02/27/2020   Brain damage    Epilepsy (HCC)    Learning disabilities    Migraines    Sensory processing difficulty    Short-term memory loss    Cortical visual impairment     Morton Peters, PT 05/26/21 2:06 PM   [facility] [address], [address] [place], [place], [address] Phone: [phone]   Fax:  [phone]  Name: Claire Stewart MRN: [id number] Date of Birth: 24-Aug-2001

## 2021-05-28 ENCOUNTER — Other Ambulatory Visit: Payer: Self-pay

## 2021-05-28 ENCOUNTER — Encounter: Payer: Self-pay | Admitting: Physical Therapy

## 2021-05-28 ENCOUNTER — Ambulatory Visit: Payer: Managed Care, Other (non HMO) | Admitting: Physical Therapy

## 2021-05-28 DIAGNOSIS — R252 Cramp and spasm: Secondary | ICD-10-CM | POA: Diagnosis not present

## 2021-05-28 DIAGNOSIS — M6281 Muscle weakness (generalized): Secondary | ICD-10-CM | POA: Diagnosis not present

## 2021-05-28 NOTE — Therapy (Signed)
[facility] [address] [place], [place], [address] Phone: [phone]   Fax:  [phone]  Physical Therapy Treatment  Patient Details  Name: Claire Stewart MRN: [id number] Date of Birth: 12-10-2000 Referring Provider (PT): Lanetta Inch MD   Encounter Date: 05/28/2021   PT End of Session - 05/28/21 0808     Visit Number 5    Date for PT Re-Evaluation 07/08/21    Authorization Type cigna managed/MCD secondary  [employer] 16 visits 9/7-10/27    Authorization - Visit Number 5    Authorization - Number of Visits 16    Progress Note Due on Visit 10    PT Start Time 0803    PT Stop Time 0845    PT Time Calculation (min) 42 min    Activity Tolerance Patient tolerated treatment well    Behavior During Therapy [facility] for tasks assessed/performed             Past Medical History:  Diagnosis Date   ADHD    Autism    mildly   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   Subjective Assessment - 05/28/21 0806     Subjective I am doing the HEP.  I stretched before bed last night and did my strengthening band exercises and it didn't hurt this time.    Pertinent History possibly endo, legally blind, sezures, cognitive impairments    Patient Stated Goals know how to manage pain; reduce    Currently in Pain? No/denies                               [facility] Adult PT Treatment/Exercise - 05/28/21 0001       Exercises   Exercises  Lumbar;Knee/Hip;Shoulder      Lumbar Exercises: Aerobic   UBE (Upper Arm Bike) L1 2x2 to warm up, PT preesnt to monitor for tolerance/pain      Lumbar Exercises: Standing   Other Standing Lumbar Exercises standing on flat side of BOSU taking away UE support slowly until balancing without UE support, total of 1' on BOSU      Lumbar Exercises: Supine   Bridge 10 reps    Other Supine Lumbar Exercises red ball rollup thighs crunch 1x10, PT cued slower for control on eccentric phase    Other Supine Lumbar Exercises feet on red ball arms crossed roll ball in and out x 20 with TA indraw focus      Knee/Hip Exercises: Standing   Lateral Step Up Left;Right;1 set;15 reps;Hand Hold: 1    Lateral Step Up Limitations onto rounded side of BOSU      Shoulder Exercises: Supine   Horizontal ABduction Strengthening;15 reps;Theraband    Theraband Level (Shoulder Horizontal ABduction) Level 2 (Red)    Diagonals Strengthening;15 reps;Left;Right;Theraband    Theraband Level (Shoulder Diagonals) Level 2 (Red)  Diagonals Limitations D2 flexion      Shoulder Exercises: Seated   Other Seated Exercises 1LB 3-way bil UE raise seated on green ball x 5 rounds                       PT Short Term Goals - 05/13/21 1531       PT SHORT TERM GOAL #1   Title ind with initial HEP    Baseline -    Time 4    Period Weeks    Status New    Target Date 06/10/21               PT Long Term Goals - 05/13/21 1531       PT LONG TERM GOAL #1   Title Pt will report decreased pain in bil sides by 75% at least 80% of the time.    Baseline -    Time 8    Period Weeks    Status New    Target Date 07/08/21      PT LONG TERM GOAL #2   Title Pt will be ind with advanced HEP for pain management    Time 8    Period Weeks    Status New      PT LONG TERM GOAL #3   Title Pt will be able to demonstrate and /or verbalize techniques to correct her posture while studying to help prevent further pain.     Baseline -    Time 8    Period Weeks    Status New      PT LONG TERM GOAL #4   Title Pt will demonstrate improved core strength with MMT of bil hip flexors and by demonstrating improved standing posture.    Time 8    Period Weeks    Status New                   Plan - 05/28/21 1150     Clinical Impression Statement Pt arrived without pain and with report of compliance with stretching and doing scapular ther ex with green bands without pain since last visit.  PT progressed UE/LE and core ther ex today in a variety of positions with good tolerance.  She demo'd improved TA indrawing today with good carry over from last session with this.  Pt needed TC and VC for technique and to slow eccentric phases of supine theraband ther ex.  She was able to demo balance on flat side of BOSU with reduction of UE support to zero from bil UEs to single UE to no UE over the course of a min today.  If good tolerance reported between now and next visit, update HEP next time.    Comorbidities possibly endo, legally blind, sezures, cognitive impairments    Rehab Potential Excellent    PT Frequency 2x / week    PT Duration 8 weeks    PT Treatment/Interventions ADLs/Self Care Home Management;Moist Heat;Therapeutic exercise;Neuromuscular re-education;Patient/family education;Manual techniques;Taping    PT Next Visit Plan core strength basic with engaging the upper and lower abdominals;  manual therapy as needed;  QL and hip flexor muscle lengthening    PT Home Exercise Plan [id number]    Consulted and Agree with Plan of Care Patient;Family member/caregiver    Family Member Consulted mother             Patient will benefit from skilled therapeutic intervention in order to improve the following deficits and  impairments:     Visit Diagnosis: Muscle weakness (generalized)  Cramp and spasm     Problem List Patient Active Problem List   Diagnosis Date Noted   Mixed hyperlipidemia 05/26/2021    Elevated TSH 05/26/2021   Seizure disorder (HCC) 08/31/2020   Development delay 08/31/2020   Chronic migraine w/o aura w/o status migrainosus, not intractable 08/31/2020   Abnormal brain MRI 08/31/2020   Generalized idiopathic epilepsy and epileptic syndromes, not intractable, with status epilepticus (HCC) 02/27/2020   Development disorder, language 02/27/2020   Chronic migraine 02/27/2020   Brain damage    Epilepsy (HCC)    Learning disabilities    Migraines    Sensory processing difficulty    Short-term memory loss    Cortical visual impairment     Morton Peters, PT 05/28/21 11:57 AM   [facility] [address], [address] [place], [place], [address] Phone: [phone]   Fax:  [phone]  Name: Claire Stewart MRN: [id number] Date of Birth: 10-09-2000

## 2021-05-31 ENCOUNTER — Other Ambulatory Visit: Payer: Self-pay

## 2021-05-31 ENCOUNTER — Encounter: Payer: Self-pay | Admitting: Family Medicine

## 2021-05-31 ENCOUNTER — Ambulatory Visit: Payer: Managed Care, Other (non HMO) | Admitting: Physical Therapy

## 2021-05-31 ENCOUNTER — Encounter: Payer: Self-pay | Admitting: Physical Therapy

## 2021-05-31 DIAGNOSIS — R252 Cramp and spasm: Secondary | ICD-10-CM | POA: Diagnosis not present

## 2021-05-31 DIAGNOSIS — M6281 Muscle weakness (generalized): Secondary | ICD-10-CM

## 2021-05-31 DIAGNOSIS — Z5320 Procedure and treatment not carried out because of patient's decision for unspecified reasons: Secondary | ICD-10-CM | POA: Insufficient documentation

## 2021-05-31 DIAGNOSIS — K219 Gastro-esophageal reflux disease without esophagitis: Secondary | ICD-10-CM | POA: Insufficient documentation

## 2021-05-31 NOTE — Therapy (Signed)
[facility] [address], [address] [place], [place], [address] Phone: [phone]   Fax:  [phone]  Physical Therapy Treatment  Patient Details  Name: Claire Stewart. Gundry MRN: [id number] Date of Birth: 11/27/2000 Referring Provider (PT): Lanetta Inch MD   Encounter Date: 05/31/2021   PT End of Session - 05/31/21 0801     Visit Number 6    Authorization Type cigna managed/MCD secondary  [employer] 16 visits 9/7-10/27    Authorization - Visit Number 6    Authorization - Number of Visits 16    Progress Note Due on Visit 10    PT Start Time 0800    PT Stop Time 0842    PT Time Calculation (min) 42 min    Activity Tolerance Patient tolerated treatment well    Behavior During Therapy [facility] for tasks assessed/performed             Past Medical History:  Diagnosis Date   ADHD    Autism    mildly   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   Subjective Assessment - 05/31/21 0804     Subjective Took my medicine so I am ok right now, just super tired.    Pertinent History possibly endo, legally blind, sezures, cognitive impairments    Currently in Pain? No/denies    Multiple Pain Sites No                               [facility] Adult PT Treatment/Exercise - 05/31/21 0001       Lumbar Exercises: Aerobic   UBE (Upper Arm Bike) L1 2x2 with Mom present to discuss status      Lumbar Exercises: Standing   Other Standing Lumbar Exercises standing on flat side  of BOSU taking away UE support slowly until balancing without UE support, total of 1' on BOSU   added 10 squats with min UE support     Lumbar Exercises: Supine   Bridge 10 reps    Other Supine Lumbar Exercises red ball rollup thighs crunch 2x10, PT cued slower for control on eccentric phase    Other Supine Lumbar Exercises feet on red ball arms crossed roll ball in and out x 20 with TA indraw focus      Lumbar Exercises: Sidelying   Other Sidelying Lumbar Exercises open books 10x right/left      Knee/Hip Exercises: Standing   Lateral Step Up Left;Right;1 set;15 reps;Hand Hold: 1    Lateral Step Up Limitations onto rounded side of BOSU      Shoulder Exercises: Seated   Other Seated Exercises 1LB 3-way bil UE raise seated on green ball x 10 rounds    Other Seated Exercises On green ball: red band scap unattached 10x each   VC for technique  PT Short Term Goals - 05/31/21 0817       PT SHORT TERM GOAL #1   Title ind with initial HEP    Time 4    Period Weeks    Status Achieved    Target Date 06/10/21               PT Long Term Goals - 05/13/21 1531       PT LONG TERM GOAL #1   Title Pt will report decreased pain in bil sides by 75% at least 80% of the time.    Baseline -    Time 8    Period Weeks    Status New    Target Date 07/08/21      PT LONG TERM GOAL #2   Title Pt will be ind with advanced HEP for pain management    Time 8    Period Weeks    Status New      PT LONG TERM GOAL #3   Title Pt will be able to demonstrate and /or verbalize techniques to correct her posture while studying to help prevent further pain.    Baseline -    Time 8    Period Weeks    Status New      PT LONG TERM GOAL #4   Title Pt will demonstrate improved core strength with MMT of bil hip flexors and by demonstrating improved standing posture.    Time 8    Period Weeks    Status New                   Plan - 05/31/21 4854      Clinical Impression Statement Pt arrives pain free after taking her muscle relaxer this morning. Pt fairly sleepy first half of session but was able to use her exercises to wake her body up. Min verbal cuing for technique today, mainly on shoulder horizontal abd/add to keep elbows from hyperextending. No pain reported during session. Improved eccentric control with all exercies.    Personal Factors and Comorbidities Comorbidity 3+    Comorbidities possibly endo, legally blind, sezures, cognitive impairments    Stability/Clinical Decision Making Evolving/Moderate complexity    Rehab Potential Excellent    PT Frequency 2x / week    PT Duration 8 weeks    PT Treatment/Interventions ADLs/Self Care Home Management;Moist Heat;Therapeutic exercise;Neuromuscular re-education;Patient/family education;Manual techniques;Taping    PT Next Visit Plan core strength basic with engaging the upper and lower abdominals;  manual therapy as needed;  QL and hip flexor muscle lengthening    PT Home Exercise Plan [email or web address]    Consulted and Agree with Plan of Care Patient;Family member/caregiver    Family Member Consulted mother             Patient will benefit from skilled therapeutic intervention in order to improve the following deficits and impairments:  Decreased range of motion, Impaired flexibility, Pain, Increased muscle spasms, Postural dysfunction, Decreased strength  Visit Diagnosis: Muscle weakness (generalized)  Cramp and spasm     Problem List Patient Active Problem List   Diagnosis Date Noted   Pap smear of cervix declined 05/31/2021   Gastroesophageal reflux disease 05/31/2021   Mixed hyperlipidemia 05/26/2021   Elevated TSH 05/26/2021   Development delay 08/31/2020   Chronic migraine w/o aura w/o status migrainosus, not intractable 08/31/2020   Generalized idiopathic epilepsy and epileptic syndromes, not intractable, with status epilepticus (HCC) 02/27/2020   Development disorder,  language 02/27/2020  Brain damage    Learning disabilities    Sensory processing difficulty    Short-term memory loss    Cortical visual impairment     Nikiesha Milford, PTA 05/31/2021, 8:46 AM  [facility] [address], [address] [place], [place], [address] Phone: [phone]   Fax:  [phone]  Name: Clorissa Gruenberg. Littrell MRN: [id number] Date of Birth: [birth year]

## 2021-06-01 ENCOUNTER — Telehealth: Payer: Self-pay | Admitting: Family Medicine

## 2021-06-01 NOTE — Telephone Encounter (Signed)
Please call mom and let her know the rheumatologist reviewed her chart and is recommending a referral to a physiatrist. There is a really good one in [place] if they are okay with me placing this referral.

## 2021-06-02 ENCOUNTER — Other Ambulatory Visit: Payer: Self-pay

## 2021-06-02 ENCOUNTER — Encounter: Payer: Self-pay | Admitting: Physical Therapy

## 2021-06-02 ENCOUNTER — Ambulatory Visit: Payer: Managed Care, Other (non HMO) | Admitting: Physical Therapy

## 2021-06-02 DIAGNOSIS — M6281 Muscle weakness (generalized): Secondary | ICD-10-CM | POA: Diagnosis not present

## 2021-06-02 DIAGNOSIS — R252 Cramp and spasm: Secondary | ICD-10-CM

## 2021-06-02 NOTE — Patient Instructions (Signed)
Access Code: [email or web address] URL: [email or web address] Date: 06/02/2021 Prepared by: Loistine Simas Mitchell Iwanicki  Exercises Supine Bridge - 2 x daily - 7 x weekly - 1-3 sets - 10 reps - 10 sec hold Child's Pose with Sidebending - 1 x daily - 7 x weekly - 1 sets - 3 reps Cat Cow to Child's Pose - 1 x daily - 7 x weekly - 1 sets - 5 reps Sidelying Thoracic Rotation with Open Book - 1 x daily - 7 x weekly - 3 sets - 10 reps Supine Hip and Knee Flexion AROM with Swiss Ball - 1 x daily - 7 x weekly - 1 sets - 10 reps Swiss Ball Sit Up - 1 x daily - 7 x weekly - 1 sets - 10 reps Standing Quadratus Lumborum Stretch with Doorway - 2 x daily - 7 x weekly - 1 sets - 2 reps - 30-60 sec hold Standing High Row with Resistance - 1 x daily - 7 x weekly - 1 sets - 10 reps Standing Shoulder Extension with Resistance - 1 x daily - 7 x weekly - 1 sets - 10 reps Shoulder External Rotation and Scapular Retraction with Resistance - 1 x daily - 7 x weekly - 1 sets - 10 reps Standing Shoulder Horizontal Abduction with Resistance - 1 x daily - 7 x weekly - 1 sets - 10 reps Standing Shoulder Diagonal Horizontal Abduction 60/120 Degrees with Resistance - 1 x daily - 7 x weekly - 1 sets - 10 reps Standing Plank on Wall - 1 x daily - 7 x weekly - 2 sets - 20 reps Wall Push Up - 1 x daily - 7 x weekly - 1 sets - 10 reps  Patient Education Forward Head Posture Office Posture

## 2021-06-02 NOTE — Therapy (Signed)
[facility] [address], [address] [place], [place], [address] Phone: [phone]   Fax:  [phone]  Physical Therapy Treatment  Patient Details  Name: Claire Stewart. Askari MRN: [id number] Date of Birth: 06-18-2001 Referring Provider (PT): Lanetta Inch MD   Encounter Date: 06/02/2021   PT End of Session - 06/02/21 0803     Visit Number 7    Date for PT Re-Evaluation 07/08/21    Authorization Type cigna managed/MCD secondary  [employer] 16 visits 9/7-10/27    Authorization - Visit Number 7    Authorization - Number of Visits 16    Progress Note Due on Visit 10    PT Start Time 0758    PT Stop Time 0843    PT Time Calculation (min) 45 min    Activity Tolerance Patient tolerated treatment well    Behavior During Therapy [facility] for tasks assessed/performed             Past Medical History:  Diagnosis Date   ADHD    Autism    mildly   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   Subjective Assessment - 06/02/21 0812     Subjective I am doing some of my exercises.  I had to skip bridges b/c of some ankle pain.    Pertinent History possibly endo, legally blind, sezures, cognitive impairments    Patient Stated Goals know how to manage pain; reduce    Currently in Pain? Yes    Pain Score 2     Pain Location Scapula    Pain Orientation Right;Left    Pain Descriptors / Indicators Tightness;Spasm    Pain Type Acute pain                                [facility] Adult PT Treatment/Exercise - 06/02/21 0001       Exercises   Exercises Lumbar;Knee/Hip;Shoulder      Lumbar Exercises: Standing   Other Standing Lumbar Exercises wall plank 1x20 sec    Other Standing Lumbar Exercises counter plank 1x20      Lumbar Exercises: Supine   Other Supine Lumbar Exercises crunch seated on red ball x 10    Other Supine Lumbar Exercises feet on red ball arms crossed roll ball in and out x 20 with TA indraw focus      Lumbar Exercises: Sidelying   Other Sidelying Lumbar Exercises open books 10x right/left      Shoulder Exercises: Standing   Horizontal ABduction Strengthening;Both;15 reps;Theraband    External Rotation Strengthening;Both;15 reps;Theraband    Theraband Level (Shoulder External Rotation) Level 2 (Red)    Extension Strengthening;15 reps;Theraband    Theraband Level (Shoulder Extension) Level 3 (Green)    Row Strengthening;Both;20 reps;Theraband    Theraband Level (Shoulder Row) Level 3 (Green)    Row Limitations 2-angle 1x10 each  Diagonals Strengthening;Both;10 reps;Theraband    Theraband Level (Shoulder Diagonals) Level 2 (Red)    Diagonals Limitations D2 flexion    Other Standing Exercises wall push up x 10      Shoulder Exercises: ROM/Strengthening   UBE (Upper Arm Bike) L1.5 2x2 PT present to discuss progress and plan to update HEP today                       PT Short Term Goals - 05/31/21 0817       PT SHORT TERM GOAL #1   Title ind with initial HEP    Time 4    Period Weeks    Status Achieved    Target Date 06/10/21               PT Long Term Goals - 05/13/21 1531       PT LONG TERM GOAL #1   Title Pt will report decreased pain in bil sides by 75% at least 80% of the time.    Baseline -    Time 8    Period Weeks    Status New    Target Date 07/08/21      PT LONG TERM GOAL #2   Title Pt will be ind with advanced HEP for pain management    Time 8    Period Weeks    Status New      PT  LONG TERM GOAL #3   Title Pt will be able to demonstrate and /or verbalize techniques to correct her posture while studying to help prevent further pain.    Baseline -    Time 8    Period Weeks    Status New      PT LONG TERM GOAL #4   Title Pt will demonstrate improved core strength with MMT of bil hip flexors and by demonstrating improved standing posture.    Time 8    Period Weeks    Status New                   Plan - 06/02/21 0844     Clinical Impression Statement Pt arrived with 2/10 pain.  She needed to skip bridges due to ankle pain yesterday.  PT updated HEP for more scapular and shoulder strength and trunk/core strength.  Pt needed cues to use core to stabilize trunk with standing band ther ex.  Pt prefers doing ther ex standing so PT emphasized with Pt's mom to help Pt cue postural stability.  Pt needed cueing for wall and counter plank set up and has difficulty activation serratus anterior in UE WB.  Pt reported reduction of pain to 1/10 end of session suggesting good tolerance.  Continue along POC.    Comorbidities possibly endo, legally blind, sezures, cognitive impairments    PT Frequency 2x / week    PT Duration 8 weeks    PT Treatment/Interventions ADLs/Self Care Home Management;Moist Heat;Therapeutic exercise;Neuromuscular re-education;Patient/family education;Manual techniques;Taping    PT Next Visit Plan how did HEP update go, continue core and scapular stabilization and strength    PT Home Exercise Plan [email or web address]    Consulted and Agree with Plan of Care Patient;Family member/caregiver    Family Member Consulted mother             Patient will benefit from skilled therapeutic intervention in order to improve the following deficits and impairments:     Visit Diagnosis: Muscle weakness (generalized)  Cramp and spasm  Problem List Patient Active Problem List   Diagnosis Date Noted   Pap smear of cervix declined 05/31/2021   Gastroesophageal  reflux disease 05/31/2021   Mixed hyperlipidemia 05/26/2021   Elevated TSH 05/26/2021   Development delay 08/31/2020   Chronic migraine w/o aura w/o status migrainosus, not intractable 08/31/2020   Generalized idiopathic epilepsy and epileptic syndromes, not intractable, with status epilepticus (HCC) 02/27/2020   Development disorder, language 02/27/2020   Brain damage    Learning disabilities    Sensory processing difficulty    Short-term memory loss    Cortical visual impairment     Morton Peters, PT 06/02/21 8:47 AM   [facility] [address], [address] [place], [place], [address] Phone: [phone]   Fax:  [phone]  Name: Jeremie Abdelaziz. Olds MRN: [id number] Date of Birth: 2001-09-11

## 2021-06-02 NOTE — Telephone Encounter (Signed)
Mom aware and verbalizes understanding and states that she talked to PT yesterday and they think that she should finish PT first since it might help resolve some things that are going on.  Mom states she will decide after patient is done with PT if she needs a referral.

## 2021-06-03 ENCOUNTER — Encounter: Payer: Self-pay | Admitting: Family Medicine

## 2021-06-03 DIAGNOSIS — M791 Myalgia, unspecified site: Secondary | ICD-10-CM

## 2021-06-07 ENCOUNTER — Encounter: Payer: Self-pay | Admitting: Physical Therapy

## 2021-06-07 ENCOUNTER — Ambulatory Visit: Payer: Managed Care, Other (non HMO) | Admitting: Physical Therapy

## 2021-06-07 ENCOUNTER — Other Ambulatory Visit: Payer: Self-pay

## 2021-06-07 DIAGNOSIS — R252 Cramp and spasm: Secondary | ICD-10-CM

## 2021-06-07 DIAGNOSIS — M6281 Muscle weakness (generalized): Secondary | ICD-10-CM | POA: Diagnosis not present

## 2021-06-07 NOTE — Therapy (Signed)
[facility] [address], [address] [place], [place], [address] Phone: [phone]   Fax:  [phone]  Physical Therapy Treatment  Patient Details  Name: Claire Stewart. Glanz MRN: [id number] Date of Birth: 12/08/2000 Referring Provider (PT): Lanetta Inch MD   Encounter Date: 06/07/2021   PT End of Session - 06/07/21 0800     Visit Number 8    Date for PT Re-Evaluation 07/08/21    Authorization Type cigna managed/MCD secondary  [employer] 16 visits 9/7-10/27    Authorization - Visit Number 8    Authorization - Number of Visits 16    Progress Note Due on Visit 10    PT Start Time 0759    PT Stop Time 0840    PT Time Calculation (min) 41 min    Activity Tolerance Patient tolerated treatment well    Behavior During Therapy [facility] for tasks assessed/performed             Past Medical History:  Diagnosis Date   ADHD    Autism    mildly   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   Subjective Assessment - 06/07/21 0801     Subjective I had a busy weekend, wasn't able to do much exercise. Tried a lot of lifting and carrying which made her abdomin hurt a lot.    Pertinent History possibly endo, legally blind, sezures, cognitive impairments    Currently in Pain? Yes    Pain Score 2     Pain Location Abdomen    Pain Orientation Lower    Pain Descriptors / Indicators Sore    Aggravating Factors  Maybe from a lot of lifting and carrying this weekend    Pain Relieving Factors  stretching                               [facility] Adult PT Treatment/Exercise - 06/07/21 0001       Lumbar Exercises: Standing   Other Standing Lumbar Exercises wall plank 1x30 sec    Other Standing Lumbar Exercises red  palloff press 10x each side      Lumbar Exercises: Quadruped   Madcat/Old Horse 10 reps    Other Quadruped Lumbar Exercises Childs pose central and with side bending      Knee/Hip Exercises: Standing   Forward Step Up --   BOSU10x, then step up to single leg stance Bil 10x light CGA     Shoulder Exercises: Seated   Other Seated Exercises 1.5LB 3-way bil UE raise seated on green ball x15-20    Other Seated Exercises Green ball sitting: green band horizintal abd 2x10: Vc for body alignment:                       PT Short Term Goals - 05/31/21 6333  PT SHORT TERM GOAL #1   Title ind with initial HEP    Time 4    Period Weeks    Status Achieved    Target Date 06/10/21               PT Long Term Goals - 05/13/21 1531       PT LONG TERM GOAL #1   Title Pt will report decreased pain in bil sides by 75% at least 80% of the time.    Baseline -    Time 8    Period Weeks    Status New    Target Date 07/08/21      PT LONG TERM GOAL #2   Title Pt will be ind with advanced HEP for pain management    Time 8    Period Weeks    Status New      PT LONG TERM GOAL #3   Title Pt will be able to demonstrate and /or verbalize techniques to correct her posture while studying to help prevent further pain.    Baseline -    Time 8    Period Weeks    Status New      PT LONG TERM GOAL #4   Title Pt will demonstrate improved core strength with MMT of bil hip flexors and by demonstrating improved standing posture.    Time 8    Period Weeks    Status New                   Plan - 06/07/21 3875     Clinical Impression Statement Pt arrives with some mild lower abdomin pain/soreness. She reports having a bust weekend  involving a lot of travel lifting/moving/carrying. For this reason she did not do many exercises, just a few random stretches. Pt was able to participate in a variety of core, shoulder and LE strength and endurance exercises including lifting task from stool. No reports of any pain increases.    Personal Factors and Comorbidities Comorbidity 3+    Comorbidities possibly endo, legally blind, sezures, cognitive impairments    Stability/Clinical Decision Making Evolving/Moderate complexity    Rehab Potential Excellent    PT Frequency 2x / week    PT Duration 8 weeks    PT Treatment/Interventions ADLs/Self Care Home Management;Moist Heat;Therapeutic exercise;Neuromuscular re-education;Patient/family education;Manual techniques;Taping    PT Next Visit Plan how did HEP update go, continue core and scapular stabilization and strength    PT Home Exercise Plan [email or web address]    Consulted and Agree with Plan of Care Patient;Family member/caregiver    Family Member Consulted mother             Patient will benefit from skilled therapeutic intervention in order to improve the following deficits and impairments:  Decreased range of motion, Impaired flexibility, Pain, Increased muscle spasms, Postural dysfunction, Decreased strength  Visit Diagnosis: Muscle weakness (generalized)  Cramp and spasm     Problem List Patient Active Problem List   Diagnosis Date Noted   Pap smear of cervix declined 05/31/2021   Gastroesophageal reflux disease 05/31/2021   Mixed hyperlipidemia 05/26/2021   Elevated TSH 05/26/2021   Development delay 08/31/2020   Chronic migraine w/o aura w/o status migrainosus, not intractable 08/31/2020   Generalized idiopathic epilepsy and epileptic syndromes, not intractable, with status epilepticus (HCC) 02/27/2020   Development disorder, language 02/27/2020   Brain damage    Learning disabilities    Sensory processing difficulty    Short-term memory loss  Cortical visual  impairment     Eston Heslin, PTA 06/07/2021, 8:45 AM  [facility] [address] [place], [place], [address] Phone: [phone]   Fax:  [phone]  Name: Embry Manrique. [place] MRN: [id number] Date of Birth: 06/03/2001

## 2021-06-09 ENCOUNTER — Other Ambulatory Visit: Payer: Self-pay

## 2021-06-09 ENCOUNTER — Ambulatory Visit: Payer: Managed Care, Other (non HMO) | Admitting: Physical Therapy

## 2021-06-09 ENCOUNTER — Encounter: Payer: Self-pay | Admitting: Physical Therapy

## 2021-06-09 DIAGNOSIS — M6281 Muscle weakness (generalized): Secondary | ICD-10-CM | POA: Diagnosis not present

## 2021-06-09 DIAGNOSIS — R252 Cramp and spasm: Secondary | ICD-10-CM

## 2021-06-09 NOTE — Therapy (Signed)
[facility] [address], [address] [place], [place], [address] Phone: [phone]   Fax:  [phone]  Physical Therapy Treatment  Patient Details  Name: Claire Stewart MRN: [id number] Date of Birth: 2001-01-01 Referring Provider (PT): Lanetta Inch MD   Encounter Date: 06/09/2021   PT End of Session - 06/09/21 0800     Visit Number 9    Date for PT Re-Evaluation 07/08/21    Authorization Type cigna managed/MCD secondary  [employer] 16 visits 9/7-10/27    Authorization - Visit Number 9    Authorization - Number of Visits 16    Progress Note Due on Visit 10    PT Start Time 0800    PT Stop Time 0842    PT Time Calculation (min) 42 min    Activity Tolerance Patient tolerated treatment well    Behavior During Therapy [facility] for tasks assessed/performed             Past Medical History:  Diagnosis Date   ADHD    Autism    mildly   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   Subjective Assessment - 06/09/21 0800     Subjective I have returned to my stretches the last two days and I feel a little stiff along Lt side still.  Just a 2/10.    Pertinent History possibly endo, legally blind, sezures, cognitive impairments    Patient Stated Goals know how to manage pain; reduce    Currently in Pain? Yes    Pain Location Flank    Pain Orientation Left;Lateral    Pain Descriptors / Indicators Tightness    Pain Type Chronic pain    Pain Onset More than a month ago    Pain  Frequency Intermittent    Pain Relieving Factors stretching                               [facility] Adult PT Treatment/Exercise - 06/09/21 0001       Exercises   Exercises Lumbar;Shoulder;Knee/Hip;Neck      Neck Exercises: Stretches   Upper Trapezius Stretch Left;Right;1 rep;30 seconds    Upper Trapezius Stretch Limitations seated, VC and demo for position      Lumbar Exercises: Stretches   Figure 4 Stretch Without overpressure;30 seconds    Figure 4 Stretch Limitations Left crossed with Rt LE ER    Other Lumbar Stretch Exercise SL "X" stretch with top arm/leg for Lt QL elongation    Other Lumbar Stretch Exercise open book into Rt Rot      Lumbar Exercises: Standing   Other Standing Lumbar Exercises lumbar flexion/SB hands on blue ball on mat table for QL stretch 3x10 sec each      Lumbar Exercises: Seated   Other Seated Lumbar Exercises SB with overhead reach stretch 2x10 bil    Other  Seated Lumbar Exercises on 1/2 BOSU 10lb kbell at chest, reverse crunch 1x10, then with 3x chest press x 3 reps      Lumbar Exercises: Quadruped   Other Quadruped Lumbar Exercises TA with serratus anterior press bil 5x5"   on floor     Knee/Hip Exercises: Seated   Sit to Sand 10 reps;Other (comment);without UE support   squat to chair, hold 10lb KB     Shoulder Exercises: ROM/Strengthening   UBE (Upper Arm Bike) L2 2x2 PT present to discuss status      Manual Therapy   Manual Therapy Soft tissue mobilization    Soft tissue mobilization Lt QL with and without active stretching                       PT Short Term Goals - 05/31/21 0817       PT SHORT TERM GOAL #1   Title ind with initial HEP    Time 4    Period Weeks    Status Achieved    Target Date 06/10/21               PT Long Term Goals - 05/13/21 1531       PT LONG TERM GOAL #1   Title Pt will report decreased pain in bil sides by 75% at least 80% of the time.    Baseline -    Time 8     Period Weeks    Status New    Target Date 07/08/21      PT LONG TERM GOAL #2   Title Pt will be ind with advanced HEP for pain management    Time 8    Period Weeks    Status New      PT LONG TERM GOAL #3   Title Pt will be able to demonstrate and /or verbalize techniques to correct her posture while studying to help prevent further pain.    Baseline -    Time 8    Period Weeks    Status New      PT LONG TERM GOAL #4   Title Pt will demonstrate improved core strength with MMT of bil hip flexors and by demonstrating improved standing posture.    Time 8    Period Weeks    Status New                   Plan - 06/09/21 0846     Clinical Impression Statement Pt with continued report of tight Lt flank and bil upper traps since traveling for wedding over the weekend.  PT performed manual therapy with and without active stretching to Lt QL and obliques with good release.  Reviewed options for QL stretching and upper trap stretching with Pt report of good release.  Pt demo'd much improved quadruped activation of bil serratus anterior and demos more automatic pattern of TA indrawing.  She continues to be quick to fatigue in shoulder and trunk stabilizers.  Continue along POC.    Comorbidities possibly endo, legally blind, sezures, cognitive impairments    PT Frequency 2x / week    PT Duration 8 weeks    PT Treatment/Interventions ADLs/Self Care Home Management;Moist Heat;Therapeutic exercise;Neuromuscular re-education;Patient/family education;Manual techniques;Taping    PT Next Visit Plan 10th visit PN next time, f/u on Lt QL and bil upper trap tightness/pain, continue stretching and stabilization    PT Home Exercise Plan [id number]    Consulted and Agree with  Plan of Care Patient;Family member/caregiver    Family Member Consulted mother             Patient will benefit from skilled therapeutic intervention in order to improve the following deficits and impairments:     Visit  Diagnosis: Muscle weakness (generalized)  Cramp and spasm     Problem List Patient Active Problem List   Diagnosis Date Noted   Pap smear of cervix declined 05/31/2021   Gastroesophageal reflux disease 05/31/2021   Mixed hyperlipidemia 05/26/2021   Elevated TSH 05/26/2021   Development delay 08/31/2020   Chronic migraine w/o aura w/o status migrainosus, not intractable 08/31/2020   Generalized idiopathic epilepsy and epileptic syndromes, not intractable, with status epilepticus (HCC) 02/27/2020   Development disorder, language 02/27/2020   Brain damage    Learning disabilities    Sensory processing difficulty    Short-term memory loss    Cortical visual impairment     Morton Peters, PT 06/09/21 8:49 AM   [facility] [address], [address] [place], [place], [address] Phone: [phone]   Fax:  [phone]  Name: Claire Stewart MRN: [id number] Date of Birth: 04/27/2001

## 2021-06-15 ENCOUNTER — Encounter: Payer: Self-pay | Admitting: Physical Therapy

## 2021-06-15 ENCOUNTER — Ambulatory Visit: Payer: Managed Care, Other (non HMO) | Attending: Obstetrics and Gynecology | Admitting: Physical Therapy

## 2021-06-15 ENCOUNTER — Other Ambulatory Visit: Payer: Self-pay

## 2021-06-15 DIAGNOSIS — M6281 Muscle weakness (generalized): Secondary | ICD-10-CM | POA: Insufficient documentation

## 2021-06-15 DIAGNOSIS — R252 Cramp and spasm: Secondary | ICD-10-CM | POA: Diagnosis present

## 2021-06-15 NOTE — Therapy (Signed)
[facility] [address] [place], [place], [address] Phone:     Fax:     Physical Therapy Treatment  Patient Details  Name: Claire Stewart MRN: [id number] Date of Birth: 06-Apr-2001 Referring Provider (PT): Lanetta Inch MD   Encounter Date: 06/15/2021   PT End of Session - 06/15/21 1716     Visit Number 10    Date for PT Re-Evaluation 07/08/21    Authorization Type cigna managed/MCD secondary  [employer] 16 visits 9/7-10/27    Authorization - Visit Number 10    Authorization - Number of Visits 16    PT Start Time 1617    PT Stop Time 1707    PT Time Calculation (min) 50 min    Activity Tolerance Patient tolerated treatment well    Behavior During Therapy WFL for tasks assessed/performed             Past Medical History:  Diagnosis Date   ADHD    Autism    mildly   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   Subjective Assessment - 06/15/21 1625     Subjective I did well after last visit until last night.  I was in tears with pain 7-8/10 in my midback and lower abdomen.  It is better today.    Pertinent History possibly endo, legally blind, sezures, cognitive impairments    Patient Stated Goals know how to manage pain; reduce    Currently in Pain? Yes    Pain Score 3     Pain Location Abdomen    Pain Type Chronic pain                               [facility] Adult PT Treatment/Exercise - 06/15/21 0001       Self-Care   Self-Care Other  Self-Care Comments    Other Self-Care Comments  pain neuroscience education      Exercises   Exercises Shoulder;Lumbar;Knee/Hip      Lumbar Exercises: Stretches   Figure 4 Stretch 1 rep;60 seconds;With overpressure    Figure 4 Stretch Limitations feet up on green ball    Other Lumbar Stretch Exercise SL "X" stretch over peanut ball x 30 sec bil    Other Lumbar Stretch Exercise standing overhead reach for QL, bil, x 30 sec      Lumbar Exercises: Standing   Other Standing Lumbar Exercises on flat side of BOSU X 1'      Lumbar Exercises: Supine   Other Supine Lumbar Exercises supine green ball crunch x 10 reps      Lumbar Exercises: Sidelying   Other Sidelying Lumbar Exercises open books x 5 each way      Lumbar Exercises: Quadruped   Other Quadruped Lumbar Exercises push ups x 10 reps      Shoulder Exercises:  Prone   Extension AROM;10 reps    Other Prone Exercises 4lb bil row x 15      Shoulder Exercises: ROM/Strengthening   UBE (Upper Arm Bike) L2 2x2 PT present to discuss status                       PT Short Term Goals - 05/31/21 0817       PT SHORT TERM GOAL #1   Title ind with initial HEP    Time 4    Period Weeks    Status Achieved    Target Date 06/10/21               PT Long Term Goals - 05/13/21 1531       PT LONG TERM GOAL #1   Title Pt will report decreased pain in bil sides by 75% at least 80% of the time.    Baseline -    Time 8    Period Weeks    Status New    Target Date 07/08/21      PT LONG TERM GOAL #2   Title Pt will be ind with advanced HEP for pain management    Time 8    Period Weeks    Status New      PT LONG TERM GOAL #3   Title Pt will be able to demonstrate and /or verbalize techniques to correct her posture while studying to help prevent further pain.    Baseline -    Time 8    Period Weeks    Status New      PT LONG TERM GOAL #4   Title Pt will demonstrate improved core strength with MMT of bil hip  flexors and by demonstrating improved standing posture.    Time 8    Period Weeks    Status New                   Plan - 06/15/21 1716     Clinical Impression Statement Pt reports her typical pain level is now down to 2-4/10 but she does have intermittent days with spikes of higher pain.  She had 7-8/10 pain last night in midback and lower abdomen following cleaning her ferret cage, seated criss cross with UE reaching to wipe down walls.  Pt needed some form cues to direct her stretches to reach her QL today.  She has improving scapular recruitment pattern and mechanics with row and ext, performed in prone today for first time.  She has weakness in her core and needs TC and VC to help maintain postural stability with dynamic ther ex such as quadruped push up today.  PT discussed pain neuroscience education with Pt's mom today related to hightened tissue sensitivity.  Pt will continue to benefit from strength and stretching to address deficits and pain.    Comorbidities possibly endo, legally blind, sezures, cognitive impairments    PT Frequency 2x / week    PT Duration 8 weeks    PT Treatment/Interventions ADLs/Self Care Home Management;Moist Heat;Therapeutic exercise;Neuromuscular re-education;Patient/family education;Manual techniques;Taping    PT Next Visit Plan review seated QL stretch, seated 3-way raises, plank work for [employer] and TA, Print production planner    PT Home Exercise Plan [id number]    Consulted and Agree with Plan of Care Patient;Family member/caregiver    Family Member Consulted mother             Patient will benefit from skilled therapeutic intervention  in order to improve the following deficits and impairments:     Visit Diagnosis: Muscle weakness (generalized)  Cramp and spasm     Problem List Patient Active Problem List   Diagnosis Date Noted   Pap smear of cervix declined 05/31/2021   Gastroesophageal reflux disease 05/31/2021   Mixed hyperlipidemia  05/26/2021   Elevated TSH 05/26/2021   Development delay 08/31/2020   Chronic migraine w/o aura w/o status migrainosus, not intractable 08/31/2020   Generalized idiopathic epilepsy and epileptic syndromes, not intractable, with status epilepticus (HCC) 02/27/2020   Development disorder, language 02/27/2020   Brain damage    Learning disabilities    Sensory processing difficulty    Short-term memory loss    Cortical visual impairment    Morton Peters, PT 06/15/21 5:23 PM   [facility] [address] [place], [place], [address] Phone:     Fax:     Name: Lylian Sanagustin. Noxon MRN: [id number] Date of Birth: [birth year]

## 2021-06-16 ENCOUNTER — Encounter: Payer: Self-pay | Admitting: Physical Therapy

## 2021-06-16 ENCOUNTER — Ambulatory Visit: Payer: Managed Care, Other (non HMO) | Admitting: Physical Therapy

## 2021-06-16 DIAGNOSIS — R252 Cramp and spasm: Secondary | ICD-10-CM

## 2021-06-16 DIAGNOSIS — M6281 Muscle weakness (generalized): Secondary | ICD-10-CM

## 2021-06-16 NOTE — Therapy (Signed)
[facility] [address] [place], [place], [address] Phone:     Fax:     Physical Therapy Treatment  Patient Details  Name: Claire Stewart MRN: [id number] Date of Birth: [birth year] Referring Provider (PT): Lanetta Inch MD   Encounter Date: 06/16/2021   PT End of Session - 06/16/21 0757     Visit Number 11    Date for PT Re-Evaluation 07/08/21    Authorization Type cigna managed/MCD secondary  [employer] 16 visits 9/7-10/27    Authorization - Visit Number 11    Authorization - Number of Visits 16    PT Start Time 0800    PT Stop Time 0843    PT Time Calculation (min) 43 min    Activity Tolerance Patient tolerated treatment well    Behavior During Therapy [facility] for tasks assessed/performed             Past Medical History:  Diagnosis Date   ADHD    Autism    mildly   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   Subjective Assessment - 06/16/21 0757     Subjective I am tired, didn't sleep really well.  Took a muscle relaxer about 1 hour ago.  Pain is about a 4/10.    Pertinent History possibly endo, legally blind, sezures, cognitive impairments    Patient Stated Goals know how to manage pain; reduce    Currently in Pain? Yes    Pain Score 4     Pain Location Back    Pain Orientation Left;Lateral;Lower    Pain Descriptors / Indicators Tightness    Pain Type Chronic pain    Pain Radiating Towards abdomen    Pain Frequency Intermittent    Aggravating Factors   overdoing activity, sitting with poor posture    Pain Relieving Factors stretching                               [facility] Adult PT Treatment/Exercise - 06/16/21 0001       Exercises   Exercises Lumbar;Knee/Hip;Shoulder      Lumbar Exercises: Stretches   Figure 4 Stretch With overpressure;60 seconds    Figure 4 Stretch Limitations feet up on green ball, Lt      Lumbar Exercises: Aerobic   Nustep L6 x 5', seat 6 arms 9, PT present to discuss symptoms and plan for today      Lumbar Exercises: Seated   Long Arc Quad on [place] Both;10 reps    LAQ on [place] Limitations on blue ball foam pad under feet      Knee/Hip Exercises: Supine   Bridges Strengthening;2 sets;5 reps    Bridges Limitations feet on green ball, 1x5 arms on table, 1x5 arms crossed      Knee/Hip Exercises: Sidelying   Hip ABduction Strengthening;Both;1 set;15 reps  Clams 1x15 bil red loop      Shoulder Exercises: Supine   Protraction Strengthening;Both;15 reps;Weights    Protraction Weight (lbs) 5    Protraction Limitations bil UE 5lb dummbell      Shoulder Exercises: Seated   Other Seated Exercises 2lb bil lat and fwd raise x 10 reps seated on green ball      Shoulder Exercises: Standing   Horizontal ABduction Strengthening;Theraband;10 reps    Theraband Level (Shoulder Horizontal ABduction) Level 3 (Green)    External Rotation Strengthening;Both;Theraband;10 reps    Theraband Level (Shoulder External Rotation) Level 3 (Green)    Extension Strengthening;15 reps;Theraband    Theraband Level (Shoulder Extension) Level 3 (Green)    Row [employer];Theraband   multi-angle W along door and high anchor each   Theraband Level (Shoulder Row) Level 3 (Green)      Shoulder Exercises: ROM/Strengthening   Other ROM/Strengthening Exercises quadruped serratus press x 10                       PT Short Term Goals - 05/31/21 0817       PT SHORT TERM GOAL #1   Title ind with  initial HEP    Time 4    Period Weeks    Status Achieved    Target Date 06/10/21               PT Long Term Goals - 05/13/21 1531       PT LONG TERM GOAL #1   Title Pt will report decreased pain in bil sides by 75% at least 80% of the time.    Baseline -    Time 8    Period Weeks    Status New    Target Date 07/08/21      PT LONG TERM GOAL #2   Title Pt will be ind with advanced HEP for pain management    Time 8    Period Weeks    Status New      PT LONG TERM GOAL #3   Title Pt will be able to demonstrate and /or verbalize techniques to correct her posture while studying to help prevent further pain.    Baseline -    Time 8    Period Weeks    Status New      PT LONG TERM GOAL #4   Title Pt will demonstrate improved core strength with MMT of bil hip flexors and by demonstrating improved standing posture.    Time 8    Period Weeks    Status New                   Plan - 06/16/21 0845     Clinical Impression Statement Pt arrived tired from a bad night's sleep.  She was able to demo standing tband ther ex for [employer] with improving scapular control but with intermittent need for postural alignment and core cueing.  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  She has weakness of Lt>Rt hip abductors.  PT progressed unstable surface seated on ball LAQ and with feet on ball for bridges with improvement with repeated reps.  Continue along POC with ongoing focus to progress HEP.    Comorbidities possibly endo, legally blind, sezures, cognitive impairments    PT Frequency 2x / week    PT Duration 8 weeks    PT Treatment/Interventions ADLs/Self Care Home Management;Moist Heat;Therapeutic exercise;Neuromuscular  re-education;Patient/family education;Manual techniques;Taping    PT Next Visit Plan HEP revision as needed    PT Home Exercise Plan [id number]    Consulted and Agree with Plan of Care Patient;Family member/caregiver     Family Member Consulted mother             Patient will benefit from skilled therapeutic intervention in order to improve the following deficits and impairments:     Visit Diagnosis: Muscle weakness (generalized)  Cramp and spasm     Problem List Patient Active Problem List   Diagnosis Date Noted   Pap smear of cervix declined 05/31/2021   Gastroesophageal reflux disease 05/31/2021   Mixed hyperlipidemia 05/26/2021   Elevated TSH 05/26/2021   Development delay 08/31/2020   Chronic migraine w/o aura w/o status migrainosus, not intractable 08/31/2020   Generalized idiopathic epilepsy and epileptic syndromes, not intractable, with status epilepticus (HCC) 02/27/2020   Development disorder, language 02/27/2020   Brain damage    Learning disabilities    Sensory processing difficulty    Short-term memory loss    Cortical visual impairment     Morton Peters, PT 06/16/21 8:49 AM   [facility] [address] [place], [place], [address] Phone:     Fax:     Name: Claire Stewart MRN: [id number] Date of Birth: 04/15/2001

## 2021-06-17 NOTE — Telephone Encounter (Signed)
Patient's mom was in the office today and we discussed the rheumatologist recommendations to see a physiatrist, not a psychiatrist. She is agreeable to this. Referral placed.

## 2021-06-22 ENCOUNTER — Ambulatory Visit: Payer: Managed Care, Other (non HMO) | Admitting: Physical Therapy

## 2021-06-22 ENCOUNTER — Encounter: Payer: Self-pay | Admitting: Physical Therapy

## 2021-06-22 ENCOUNTER — Other Ambulatory Visit: Payer: Self-pay

## 2021-06-22 DIAGNOSIS — M6281 Muscle weakness (generalized): Secondary | ICD-10-CM

## 2021-06-22 DIAGNOSIS — R252 Cramp and spasm: Secondary | ICD-10-CM

## 2021-06-22 NOTE — Therapy (Signed)
[facility] [address] [place], [place], [address] Phone: [phone]   Fax:  [phone]  Physical Therapy Treatment  Patient Details  Name: Claire Stewart MRN: [id number] Date of Birth: 03-10-2001 Referring Provider (PT): Lanetta Inch MD   Encounter Date: 06/22/2021   PT End of Session - 06/22/21 0948     Visit Number 12    Date for PT Re-Evaluation 07/08/21    Authorization Type cigna managed/MCD secondary  [employer] 16 visits 9/7-10/27    Authorization - Visit Number 12    Authorization - Number of Visits 16    Progress Note Due on Visit 20    PT Start Time 0800    PT Stop Time 0843    PT Time Calculation (min) 43 min    Activity Tolerance Patient tolerated treatment well    Behavior During Therapy [facility] for tasks assessed/performed             Past Medical History:  Diagnosis Date   ADHD    Autism    mildly   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   Subjective Assessment - 06/22/21 0759     Subjective I started using my WiiFit and it gives me more exercise.    Pertinent History possibly endo, legally blind, sezures, cognitive impairments    Patient Stated Goals know how to manage pain; reduce    Pain Score 2     Pain Location Back    Pain Orientation Mid;Upper    Pain Descriptors / Indicators Tightness    Pain Type Chronic pain                               [facility] Adult PT Treatment/Exercise - 06/22/21 0001        Exercises   Exercises Shoulder;Lumbar;Knee/Hip      Neck Exercises: Machines for Strengthening   UBE (Upper Arm Bike) L2 2x2 PT present to discuss symptoms      Lumbar Exercises: Stretches   Figure 4 Stretch 60 seconds;Without overpressure;Supine    Figure 4 Stretch Limitations feet up on green ball, Lt an Rt 1x    Other Lumbar Stretch Exercise mad cat x 5    Other Lumbar Stretch Exercise mad cat to child's pose 3 way arms 1x10 each      Lumbar Exercises: Seated   Other Seated Lumbar Exercises SB with overhead reach 1x20 each way, VC to plant pelvis into seat and reach away to maximize stretch      Lumbar Exercises: Supine   Bridge 10 reps    Bridge Limitations arms crossed    Other Supine Lumbar Exercises crunch x 5 straight and oblique bil (Pt prefers without ball roll up thighs)      Lumbar Exercises: Sidelying   Other Sidelying Lumbar Exercises open books x 5  each way      Lumbar Exercises: Quadruped   Other Quadruped Lumbar Exercises bear plank 2x10 sec (settled on this plank after exploring full plank, plank on elbows/knees - better stability with this)      Shoulder Exercises: Standing   Horizontal ABduction Strengthening;Both;15 reps;Theraband    Theraband Level (Shoulder Horizontal ABduction) Level 3 (Green)    External Rotation Strengthening;Both;15 reps;Theraband    Theraband Level (Shoulder External Rotation) Level 3 (Green)    External Rotation Limitations hold band in both hands palms up    Extension Strengthening;15 reps;Theraband    Theraband Level (Shoulder Extension) Level 3 (Green)    Extension Limitations over top of doorway    Row Strengthening;Both;15 reps;Theraband    Theraband Level (Shoulder Row) Level 3 (Green)    Row Limitations W row over top of door and high anchor row                     PT Education - 06/22/21 0801     Education Details revised HEP to change order and modify    Person(s) Educated Patient    Methods  Explanation;Handout    Comprehension Verbalized understanding;Returned demonstration              PT Short Term Goals - 05/31/21 0817       PT SHORT TERM GOAL #1   Title ind with initial HEP    Time 4    Period Weeks    Status Achieved    Target Date 06/10/21               PT Long Term Goals - 05/13/21 1531       PT LONG TERM GOAL #1   Title Pt will report decreased pain in bil sides by 75% at least 80% of the time.    Baseline -    Time 8    Period Weeks    Status New    Target Date 07/08/21      PT LONG TERM GOAL #2   Title Pt will be ind with advanced HEP for pain management    Time 8    Period Weeks    Status New      PT LONG TERM GOAL #3   Title Pt will be able to demonstrate and /or verbalize techniques to correct her posture while studying to help prevent further pain.    Baseline -    Time 8    Period Weeks    Status New      PT LONG TERM GOAL #4   Title Pt will demonstrate improved core strength with MMT of bil hip flexors and by demonstrating improved standing posture.    Time 8    Period Weeks    Status New                   Plan - 06/22/21 0948     Clinical Impression Statement Pt arrived with low pain level and report of good symptom management over the weekend with stretches.  PT had reorganized Pt's HEP by body part and position for HEP and edited some ther ex on HEP so gave print out of complete HEP to Pt's mom to follow along and we reviewed from the beginning to ensure good flow and understanding by Pt.  Did not have time to complete all exercises so next visit will aim to complete the review of final aspects of HEP.  Added bear plank today  as a new exercise after exploring full plank and plank on knees/elbows but Pt with weakness in bil shoulder SA and core to support properly at this time.  Pt demos much improving trunk stabilization with green tband shoulder ther ex as compared to previous visits.  Pt may benefit from addition  of hip strength if she isn't too overwhelmed by current program.    Comorbidities possibly endo, legally blind, sezures, cognitive impairments    Rehab Potential Excellent    PT Frequency 2x / week    PT Duration 8 weeks    PT Treatment/Interventions ADLs/Self Care Home Management;Moist Heat;Therapeutic exercise;Neuromuscular re-education;Patient/family education;Manual techniques;Taping    PT Next Visit Plan UBE warm up, pick up on Medbridge review starting at d2 flexion, review bear plank since new addition last time, add hip abd and ER strength to HEP    PT Home Exercise Plan [email or web address]    Consulted and Agree with Plan of Care Patient;Family member/caregiver    Family Member Consulted mother             Patient will benefit from skilled therapeutic intervention in order to improve the following deficits and impairments:     Visit Diagnosis: Muscle weakness (generalized)  Cramp and spasm     Problem List Patient Active Problem List   Diagnosis Date Noted   Pap smear of cervix declined 05/31/2021   Gastroesophageal reflux disease 05/31/2021   Mixed hyperlipidemia 05/26/2021   Elevated TSH 05/26/2021   Development delay 08/31/2020   Chronic migraine w/o aura w/o status migrainosus, not intractable 08/31/2020   Generalized idiopathic epilepsy and epileptic syndromes, not intractable, with status epilepticus (HCC) 02/27/2020   Development disorder, language 02/27/2020   Brain damage    Learning disabilities    Sensory processing difficulty    Short-term memory loss    Cortical visual impairment     Morton Peters, PT 06/22/21 9:55 AM   [facility] [address] [place], [place], [address] Phone: [phone]   Fax:  [phone]  Name: Claire Stewart MRN: [id number] Date of Birth: [birth year]

## 2021-06-22 NOTE — Patient Instructions (Signed)
Access Code: [email or web address] URL: [email or web address] Date: 06/22/2021 Prepared by: Loistine Simas Shantae Vantol  Exercises Seated Sidebending Arms Overhead - 1 x daily - 7 x weekly - 3 sets - 10 reps Cat Cow to Child's Pose - 1 x daily - 7 x weekly - 1 sets - 5 reps Child's Pose with Sidebending - 1 x daily - 7 x weekly - 1 sets - 3 reps - 10 hold Sidelying Thoracic Rotation with Open Book - 1 x daily - 7 x weekly - 1 sets - 10 reps Supine Bridge - 2 x daily - 7 x weekly - 1-3 sets - 10 reps - 10 sec hold Supine Hip and Knee Flexion AROM with Swiss Ball - 1 x daily - 7 x weekly - 1 sets - 2 reps - 30 hold Plank on Knees - 1 x daily - 7 x weekly - 1 sets - 3 reps - 15 hold Curl Up with Arms Crossed - 1 x daily - 7 x weekly - 1 sets - 10 reps Supine Oblique Crunch with Ball Between Knees - 1 x daily - 7 x weekly - 1 sets - 10 reps Standing Quadratus Lumborum Stretch with Doorway - 2 x daily - 7 x weekly - 1 sets - 2 reps - 30-60 sec hold Standing Lat Pull Down with Resistance - Elbows Bent - 1 x daily - 7 x weekly - 2 sets - 10 reps Shoulder extension with resistance - Neutral - 1 x daily - 7 x weekly - 2 sets - 10 reps Standing Row with Resistance with Anchored Resistance at Chest Height Palms Down - 1 x daily - 7 x weekly - 2 sets - 10 reps Shoulder External Rotation and Scapular Retraction with Resistance - 1 x daily - 7 x weekly - 2 sets - 10 reps Standing Shoulder Horizontal Abduction with Resistance - 1 x daily - 7 x weekly - 2 sets - 10 reps Standing Shoulder Single Arm PNF D2 Flexion with Resistance - 1 x daily - 7 x weekly - 2 sets - 10 reps Wall Push Up - 1 x daily - 7 x weekly - 1 sets - 10 reps Seated Shoulder Flexion with Dumbbells - 1 x daily - 7 x weekly - 1 sets - 5 reps Seated Shoulder Abduction with Dumbbells - Thumbs Up - 1 x daily - 7 x weekly - 3 sets - 10 reps

## 2021-06-24 ENCOUNTER — Ambulatory Visit: Payer: Managed Care, Other (non HMO)

## 2021-06-24 ENCOUNTER — Other Ambulatory Visit: Payer: Self-pay

## 2021-06-24 DIAGNOSIS — M6281 Muscle weakness (generalized): Secondary | ICD-10-CM | POA: Diagnosis not present

## 2021-06-24 DIAGNOSIS — R252 Cramp and spasm: Secondary | ICD-10-CM

## 2021-06-24 NOTE — Therapy (Signed)
[facility] [address] [place], [place], [address] Phone: [phone]   Fax:  [phone]  Physical Therapy Treatment  Patient Details  Name: Claire Stewart MRN: [id number] Date of Birth: [birth year] Referring Provider (PT): Lanetta Inch MD   Encounter Date: 06/24/2021   PT End of Session - 06/24/21 0849     Visit Number 13    Date for PT Re-Evaluation 07/08/21    Authorization Type cigna managed/MCD secondary  [employer] 16 visits 9/7-10/27    Authorization - Visit Number 13    Authorization - Number of Visits 16    Progress Note Due on Visit 20    PT Start Time 0801    PT Stop Time 0846    PT Time Calculation (min) 45 min    Activity Tolerance Patient tolerated treatment well    Behavior During Therapy [facility] for tasks assessed/performed             Past Medical History:  Diagnosis Date   ADHD    Autism    mildly   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   Subjective Assessment - 06/24/21 0828     Subjective My core is sore from doing Wifitl.    Patient Stated Goals know how to manage pain; reduce    Currently in Pain? Yes    Pain Score 2     Pain Location Back                               [facility] Adult PT Treatment/Exercise - 06/24/21 0001       Neck Exercises: Machines for Strengthening   UBE (Upper Arm Bike) L2 2x2 PT present to discuss symptoms      Lumbar Exercises: Seated   Other Seated Lumbar Exercises shoulder  flexion and abduction x10 each.  2# sitting on ball      Lumbar Exercises: Supine   Bridge 10 reps    Bridge Limitations arms crossed    Other Supine Lumbar Exercises crunch x 5 straight and oblique bil (Pt prefers without ball roll up thighs)      Lumbar Exercises: Sidelying   Other Sidelying Lumbar Exercises open books x 5 each way      Shoulder Exercises: Standing   External Rotation Strengthening;Both;15 reps;Theraband    Theraband Level (Shoulder External Rotation) Level 3 (Green)    Diagonals Strengthening;Both;20 reps;Theraband    Theraband Level (Shoulder Diagonals) Level 3 (Green)    Other Standing Exercises doorway stretch x20 seconds each                       PT Short Term Goals - 05/31/21 0817       PT SHORT TERM GOAL #1   Title ind with initial HEP    Time 4  Period Weeks    Status Achieved    Target Date 06/10/21               PT Long Term Goals - 05/13/21 1531       PT LONG TERM GOAL #1   Title Pt will report decreased pain in bil sides by 75% at least 80% of the time.    Baseline -    Time 8    Period Weeks    Status New    Target Date 07/08/21      PT LONG TERM GOAL #2   Title Pt will be ind with advanced HEP for pain management    Time 8    Period Weeks    Status New      PT LONG TERM GOAL #3   Title Pt will be able to demonstrate and /or verbalize techniques to correct her posture while studying to help prevent further pain.    Baseline -    Time 8    Period Weeks    Status New      PT LONG TERM GOAL #4   Title Pt will demonstrate improved core strength with MMT of bil hip flexors and by demonstrating improved standing posture.    Time 8    Period Weeks    Status New                   Plan - 06/24/21 0846     Clinical Impression Statement PT had reorganized Pt's HEP by body part and position for HEP and edited some ther ex on HEP so gave print out of complete HEP to Pt's mom to follow along and we  reviewed from the 2nd half of exercises that were not covered last session to ensure good flow and understanding by Pt.  PT provided tactile and verbal cues for technique and core engagement today.  Pt demos much improving trunk stabilization with green tband shoulder ther ex as compared to previous visits.  Pt will continue to benefit from skilled PT to address lumbar pain, flexibility and core strength.    PT Frequency 2x / week    PT Duration 8 weeks    PT Treatment/Interventions ADLs/Self Care Home Management;Moist Heat;Therapeutic exercise;Neuromuscular re-education;Patient/family education;Manual techniques;Taping    PT Next Visit Plan continue strength and flexibility    PT Home Exercise Plan [email or web address]    Recommended Other Services initial cert is signed    Consulted and Agree with Plan of Care Patient;Family member/caregiver             Patient will benefit from skilled therapeutic intervention in order to improve the following deficits and impairments:  Decreased range of motion, Impaired flexibility, Pain, Increased muscle spasms, Postural dysfunction, Decreased strength  Visit Diagnosis: Muscle weakness (generalized)  Cramp and spasm     Problem List Patient Active Problem List   Diagnosis Date Noted   Pap smear of cervix declined 05/31/2021   Gastroesophageal reflux disease 05/31/2021   Mixed hyperlipidemia 05/26/2021   Elevated TSH 05/26/2021   Development delay 08/31/2020   Chronic migraine w/o aura w/o status migrainosus, not intractable 08/31/2020   Generalized idiopathic epilepsy and epileptic syndromes, not intractable, with status epilepticus (HCC) 02/27/2020   Development disorder, language 02/27/2020   Brain damage    Learning disabilities    Sensory processing difficulty    Short-term memory loss    Cortical visual impairment     Lorrene Reid, PT 06/24/21 8:52 AM  [facility] [address] [place], [place], [address] Phone: [phone]   Fax:  [phone]  Name: Claire Stewart MRN: [id number] Date of Birth: 2001/06/26

## 2021-06-29 ENCOUNTER — Ambulatory Visit: Payer: Managed Care, Other (non HMO) | Admitting: Physical Therapy

## 2021-06-29 ENCOUNTER — Other Ambulatory Visit: Payer: Self-pay

## 2021-06-29 ENCOUNTER — Encounter: Payer: Self-pay | Admitting: Physical Therapy

## 2021-06-29 DIAGNOSIS — M6281 Muscle weakness (generalized): Secondary | ICD-10-CM

## 2021-06-29 DIAGNOSIS — R252 Cramp and spasm: Secondary | ICD-10-CM

## 2021-06-29 NOTE — Therapy (Signed)
[facility] [address] [place], [place], [address] Phone: [phone]   Fax:  [phone]  Physical Therapy Treatment  Patient Details  Name: Claire Stewart MRN: [id number] Date of Birth: 05/25/2001 Referring Provider (PT): Lanetta Inch MD   Encounter Date: 06/29/2021   PT End of Session - 06/29/21 0822     Visit Number 14    Date for PT Re-Evaluation 07/08/21    Authorization Type cigna managed/MCD secondary  [employer]y [employer] 16 visits 9/7-10/27    Authorization - Visit Number 14    Authorization - Number of Visits 16    Progress Note Due on Visit 20    PT Start Time 0800    PT Stop Time 0843    PT Time Calculation (min) 43 min    Activity Tolerance Patient tolerated treatment well    Behavior During Therapy [facility] for tasks assessed/performed             Past Medical History:  Diagnosis Date   ADHD    Autism    mildly   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   Subjective Assessment - 06/29/21 0800     Subjective Pt and her mom report she does have some ongoing tightness or soreness some of which she can "get out" and others (along sides/ribcage) that is harder to find a stretch for.    Pertinent History possibly endo, legally blind, sezures, cognitive impairments    Patient Stated Goals know how to manage pain; reduce    Currently in Pain? Yes    Pain Score 3     Pain Location Back    Pain Orientation Right;Left;Lateral    Pain Descriptors / Indicators  Tightness    Pain Onset More than a month ago    Pain Frequency Intermittent    Pain Relieving Factors stretching                               [facility] Adult PT Treatment/Exercise - 06/29/21 0001       Exercises   Exercises Shoulder;Knee/Hip;Lumbar      Neck Exercises: Machines for Strengthening   UBE (Upper Arm Bike) L2 3x3      Lumbar Exercises: Stretches   Other Lumbar Stretch Exercise QL standing SB with overhead reach SB stretch 1x20 each way, VC to fix foot into floor      Lumbar Exercises: Supine   Bridge 10 reps    Bridge Limitations arms crossed, feet on red ball    Other Supine Lumbar Exercises crunch x 5 straight and oblique bil (Pt prefers without ball roll up thighs)      Lumbar Exercises: Sidelying   Other Sidelying Lumbar Exercises open books x 5 each way      Lumbar Exercises: Quadruped   Plank primal plank 3x15"  Shoulder Exercises: Standing   Diagonals Strengthening;Both;Theraband;20 reps    Theraband Level (Shoulder Diagonals) Level 3 (Green)      Shoulder Exercises: ROM/Strengthening   Other ROM/Strengthening Exercises 2-way raises seated on ball x 10 abd and flexion                       PT Short Term Goals - 05/31/21 0817       PT SHORT TERM GOAL #1   Title ind with initial HEP    Time 4    Period Weeks    Status Achieved    Target Date 06/10/21               PT Long Term Goals - 05/13/21 1531       PT LONG TERM GOAL #1   Title Pt will report decreased pain in bil sides by 75% at least 80% of the time.    Baseline -    Time 8    Period Weeks    Status New    Target Date 07/08/21      PT LONG TERM GOAL #2   Title Pt will be ind with advanced HEP for pain management    Time 8    Period Weeks    Status New      PT LONG TERM GOAL #3   Title Pt will be able to demonstrate and /or verbalize techniques to correct her posture while studying to help prevent further pain.    Baseline -    Time 8     Period Weeks    Status New      PT LONG TERM GOAL #4   Title Pt will demonstrate improved core strength with MMT of bil hip flexors and by demonstrating improved standing posture.    Time 8    Period Weeks    Status New                   Plan - 06/29/21 8416     Clinical Impression Statement Pt reported 3/10 tightness along bil flank and ribcage which she needed reminders and review of stretches to address.  She did best with seated ball rollouts with SB today to address tightness.  PT had Pt lead through shoulder tband ther ex to demo more independence for set up and form and memory of her program. She needed ongoing reminders to be aware of core and posture to avoid sway back.  PT has tapered visits to 1x/week to work toward HEP ind.  Pt is using WiFit and yoga videos at home as well.    Comorbidities possibly endo, legally blind, sezures, cognitive impairments    PT Frequency 2x / week    PT Duration 8 weeks    PT Next Visit Plan continue strength and flexibility, ERO    PT Home Exercise Plan [email or web address]    Consulted and Agree with Plan of Care Patient;Family member/caregiver    Family Member Consulted mother             Patient will benefit from skilled therapeutic intervention in order to improve the following deficits and impairments:     Visit Diagnosis: Muscle weakness (generalized)  Cramp and spasm     Problem List Patient Active Problem List   Diagnosis Date Noted   Pap smear of cervix declined 05/31/2021   Gastroesophageal reflux disease 05/31/2021   Mixed hyperlipidemia 05/26/2021   Elevated TSH 05/26/2021   Development delay 08/31/2020  Chronic migraine w/o aura w/o status migrainosus, not intractable 08/31/2020   Generalized idiopathic epilepsy and epileptic syndromes, not intractable, with status epilepticus (HCC) 02/27/2020   Development disorder, language 02/27/2020   Brain damage    Learning disabilities    Sensory processing difficulty     Short-term memory loss    Cortical visual impairment     Morton Peters, PT 06/29/21 8:44 AM   [facility] [address] [place], [place], [address] Phone: [phone]   Fax:  [phone]  Name: Claire Stewart MRN: [id number] Date of Birth: [birth year]

## 2021-07-06 ENCOUNTER — Encounter: Payer: Self-pay | Admitting: Physical Therapy

## 2021-07-06 ENCOUNTER — Other Ambulatory Visit: Payer: Self-pay

## 2021-07-06 ENCOUNTER — Ambulatory Visit: Payer: Managed Care, Other (non HMO) | Admitting: Physical Therapy

## 2021-07-06 DIAGNOSIS — M6281 Muscle weakness (generalized): Secondary | ICD-10-CM

## 2021-07-06 DIAGNOSIS — R252 Cramp and spasm: Secondary | ICD-10-CM

## 2021-07-06 NOTE — Therapy (Signed)
[facility] [address] [place], [place], [address] Phone: [phone]   Fax:  [phone]  Physical Therapy Treatment  Patient Details  Name: Claire Stewart MRN: [id number] Date of Birth: 09/28/2000 Referring Provider (PT): Lanetta Inch MD   Encounter Date: 07/06/2021   PT End of Session - 07/06/21 0758     Visit Number 15    Date for PT Re-Evaluation 08/03/21    Authorization Type cigna managed/MCD secondary  [employer] 16 visits 9/7-10/27    Authorization - Visit Number 15    Authorization - Number of Visits 16    Progress Note Due on Visit 20    PT Start Time 0800    PT Stop Time 0840    PT Time Calculation (min) 40 min    Activity Tolerance Patient tolerated treatment well    Behavior During Therapy WFL for tasks assessed/performed             Past Medical History:  Diagnosis Date   ADHD    Autism    mildly   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       [facility] PT Assessment - 07/06/21 0001       Assessment   Medical Diagnosis muscles spasm    Referring Provider (PT) Lanetta Inch MD    Onset Date/Surgical Date 02/23/21    Next MD Visit one year    Prior Therapy yes      AROM   Overall AROM Comments full and painfree      Strength   Overall Strength Comments UEs 4+/5 to 5/5, core 4/5, LEs 5/5 grossly      Palpation   Palpation comment Lt QL, obliques tender and tight but improved from initial presentation                            [facility] Adult PT Treatment/Exercise - 07/06/21 0001       Exercises   Exercises Shoulder;Knee/Hip;Lumbar      Neck Exercises: Machines for Strengthening   UBE (Upper Arm Bike) L2 3x3, PT present to discuss progress and review goals      Lumbar Exercises: Seated   Other Seated Lumbar Exercises lumbar blue ball rollouts x 5 each, 3-way      Lumbar Exercises: Supine   Bridge 20 reps    Bridge Limitations arms crossed, feet on red ball    Other Supine Lumbar Exercises crunch x 5 straight and oblique bil (Pt prefers without ball roll up thighs)    Other Supine Lumbar Exercises beach ball transfers with hip flexion feet to hands x 5 rounds      Lumbar Exercises: Quadruped   Plank primal plank 3x20"      Shoulder Exercises: Seated   Horizontal ABduction Strengthening;Both;20 reps;Theraband    Theraband Level (Shoulder Horizontal ABduction) Level 3 (  Green)    External Rotation Strengthening;Both;20 reps;Theraband    Theraband Level (Shoulder External Rotation) Level 3 (Green)      Shoulder Exercises: Standing   Extension Strengthening;Both;15 reps;Theraband    Theraband Level (Shoulder Extension) Level 3 (Green)    Row [employer];Theraband    Theraband Level (Shoulder Row) Level 4 (Blue)    Row Limitations overhead and chest height each x 20    Diagonals Strengthening;Both;Theraband;20 reps    Theraband Level (Shoulder Diagonals) Level 3 [place] [facility])                       PT Short Term Goals - 05/31/21 0817       PT SHORT TERM GOAL #1   Title ind with initial HEP    Time 4    Period Weeks    Status Achieved    Target Date 06/10/21               PT Long Term Goals - 07/06/21 0759       PT LONG TERM GOAL #1   Title Pt will report decreased pain in bil sides by 75% at least 80% of the time.    Status Achieved      PT LONG TERM GOAL #2   Title Pt will be ind with advanced HEP for pain management    Baseline needs  intermittent cues for core control with overlay of trunk challenge and UE ther ex    Status On-going      PT LONG TERM GOAL #3   Title Pt will be able to demonstrate and /or verbalize techniques to correct her posture while studying to help prevent further pain.    Status Achieved      PT LONG TERM GOAL #4   Title Pt will demonstrate improved core strength with MMT of bil hip flexors and by demonstrating improved standing posture.    Baseline substitutes slightly with trunk lean    Status On-going                   Plan - 07/06/21 0758     Clinical Impression Statement Pt arrived with report of pain no greater than 2/10 over the last 4 days.  Pt is making excellent progress with postural awareness and strength.  UEs are 4+/5 and core strength is 4/5.  She has full trunk ROM with some end range excessive mobility due to low tone of deep core stabilizers.  She has some ongoing soft tissue tension in Lt QL and intermittent tightness around obliques and upper traps bil which seem to be her substitution muscles in lieu of core.  Pt is able to generate good SA activation with primal plank and wall push ups.  Pain intensity has consistently been 3/10 or below with occasional report of spikes in tightness in lumbar and thoracic soft tissues with a busy weekend.  She is compliant with HEP but does still need intermittent cueing for postural set up and maintained core control with overlay of UE ther ex and trunk stabilization challenges.  She is also using her WiiFit for additional exercise.  Tightness is well managed with stretching and PT believes her tone will continue to normalize as her core strength improves.  Her mother attends her sessions and provides needed support at home due to memory and vision deficits related to PMH.  Pt continues to home school with improved understanding of aggravating postures to reduce frequency and severity of LBP  and tightness.  Pt will benefit from tapered PT to  1x/week for 3-4 more sessions to solidify confidence and independence with HEP.    Personal Factors and Comorbidities Comorbidity 3+    Comorbidities possibly endo, legally blind, sezures, cognitive impairments    Examination-Activity Limitations Sit    Examination-Participation Restrictions School;Community Activity    Stability/Clinical Decision Making Evolving/Moderate complexity    Clinical Decision Making Low    Rehab Potential Excellent    PT Frequency 1x / week    PT Duration 4 weeks    PT Treatment/Interventions ADLs/Self Care Home Management;Moist Heat;Therapeutic exercise;Neuromuscular re-education;Patient/family education;Manual techniques;Taping    PT Next Visit Plan finalize and create more ind with HEP with focus on central core control within ther ex    PT Home Exercise Plan [id number]    Consulted and Agree with Plan of Care Patient    Family Member Consulted mother             Patient will benefit from skilled therapeutic intervention in order to improve the following deficits and impairments:  Decreased range of motion, Impaired flexibility, Pain, Increased muscle spasms, Postural dysfunction, Decreased strength  Visit Diagnosis: Muscle weakness (generalized) - Plan: PT plan of care cert/re-cert  Cramp and spasm - Plan: PT plan of care cert/re-cert     Problem List Patient Active Problem List   Diagnosis Date Noted   Pap smear of cervix declined 05/31/2021   Gastroesophageal reflux disease 05/31/2021   Mixed hyperlipidemia 05/26/2021   Elevated TSH 05/26/2021   Development delay 08/31/2020   Chronic migraine w/o aura w/o status migrainosus, not intractable 08/31/2020   Generalized idiopathic epilepsy and epileptic syndromes, not intractable, with status epilepticus (HCC) 02/27/2020   Development disorder, language 02/27/2020   Brain damage    Learning disabilities    Sensory processing difficulty    Short-term memory loss    Cortical visual impairment      Morton Peters, PT 07/06/21 8:39 AM      [facility] @ [facility] [address] [place], [place], [address] Phone: [phone]   Fax:  [phone]  Name: Claire Stewart MRN: [id number] Date of Birth: 2000-11-19

## 2021-07-13 ENCOUNTER — Other Ambulatory Visit: Payer: Self-pay

## 2021-07-13 ENCOUNTER — Ambulatory Visit: Payer: Managed Care, Other (non HMO) | Attending: Obstetrics and Gynecology | Admitting: Physical Therapy

## 2021-07-13 ENCOUNTER — Encounter: Payer: Self-pay | Admitting: Physical Therapy

## 2021-07-13 DIAGNOSIS — R252 Cramp and spasm: Secondary | ICD-10-CM | POA: Diagnosis present

## 2021-07-13 DIAGNOSIS — M6281 Muscle weakness (generalized): Secondary | ICD-10-CM | POA: Insufficient documentation

## 2021-07-13 NOTE — Therapy (Signed)
[facility] [address] [place], [place], [address] Phone: [phone]   Fax:  [phone]  Physical Therapy Treatment  Patient Details  Name: Claire Stewart MRN: [id number] Date of Birth: 08-23-2001 Referring Provider (PT): Lanetta Inch MD   Encounter Date: 07/13/2021   PT End of Session - 07/13/21 0800     Visit Number 16    Date for PT Re-Evaluation 08/03/21    Authorization Type cigna managed/MCD secondary    Authorization - Visit Number 16    Progress Note Due on Visit 20    PT Start Time 0800    PT Stop Time 0840    PT Time Calculation (min) 40 min    Activity Tolerance Patient tolerated treatment well    Behavior During Therapy [facility] for tasks assessed/performed             Past Medical History:  Diagnosis Date   ADHD    Autism    mildly   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   Subjective Assessment - 07/13/21 0800     Subjective I have been doing really good.  So much better than when I first started.  I did have some shooting pain in my neck and arm in the night last night but it's not bad this morning.  I may have overdone my stretches.    Pertinent History possibly endo, legally blind, sezures, cognitive impairments    Patient Stated Goals know how to manage pain; reduce    Currently in Pain? No/denies    Pain Type Chronic pain                               [facility] Adult PT Treatment/Exercise - 07/13/21 0001        Exercises   Exercises Shoulder;Knee/Hip;Lumbar      Neck Exercises: Machines for Strengthening   UBE (Upper Arm Bike) L2 3x3, PT present to discuss progress and symptoms      Neck Exercises: Stretches   Levator Stretch 2 reps;20 seconds    Neck Stretch 1 rep;20 seconds    Neck Stretch Limitations cervical SB      Lumbar Exercises: Stretches   Other Lumbar Stretch Exercise 3-way seated ball rollouts 5x5" each way, TC and VC to fix pelvis for improved QL stretch with diagonals      Lumbar Exercises: Seated   Sit to Stand 15 reps   hold 5lb kbell     Lumbar Exercises: Supine   Bridge 20 reps    Bridge Limitations arms crossed, feet on red ball    Other Supine Lumbar Exercises crunch 2 x 5 straight and oblique bil (Pt prefers without ball roll up thighs)    Other Supine Lumbar Exercises beach ball transfers with hip flexion feet to hands 2 x 5 rounds  Lumbar Exercises: Sidelying   Other Sidelying Lumbar Exercises open books x 5 each side      Lumbar Exercises: Quadruped   Plank primal plank 3x20"      Shoulder Exercises: Seated   Horizontal ABduction Strengthening;Both;20 reps;Theraband    Theraband Level (Shoulder Horizontal ABduction) Level 3 (Green)    External Rotation Strengthening;Both;Theraband;15 reps    Theraband Level (Shoulder External Rotation) Level 3 (Green)    Other Seated Exercises 3lb 3-way raises 2x5 each seated on ball      Shoulder Exercises: Standing   Extension Strengthening;Theraband;15 reps    Theraband Level (Shoulder Extension) Level 3 (Green)    Row Strengthening;Both;Theraband;20 reps    Theraband Level (Shoulder Row) Level 4 [facility])                       PT Short Term Goals - 05/31/21 0817       PT SHORT TERM GOAL #1   Title ind with initial HEP    Time 4    Period Weeks    Status Achieved    Target Date 06/10/21               PT Long Term Goals - 07/06/21 0759       PT LONG TERM GOAL #1   Title Pt will report  decreased pain in bil sides by 75% at least 80% of the time.    Status Achieved      PT LONG TERM GOAL #2   Title Pt will be ind with advanced HEP for pain management    Baseline needs intermittent cues for core control with overlay of trunk challenge and UE ther ex    Status On-going      PT LONG TERM GOAL #3   Title Pt will be able to demonstrate and /or verbalize techniques to correct her posture while studying to help prevent further pain.    Status Achieved      PT LONG TERM GOAL #4   Title Pt will demonstrate improved core strength with MMT of bil hip flexors and by demonstrating improved standing posture.    Baseline substitutes slightly with trunk lean    Status On-going                   Plan - 07/13/21 0811     Clinical Impression Statement Pt arrived with report of compliance with HEP and was painfree for first time since starting PT.  She is needing less cueing for effective form with stretches having learned to fix pelvis with trunk stretching for proper elongation without strain of spine.  She needed cueing for stretching for neck tightness today along levator Lt>Rt.  She demos much improved trunk control using proper core activation with UE ther ex in sitting and standing.  She is able to perform bridge with feet on ball without UE support on mat table now. She is becoming more ind with all ther ex and successfully managing pain with tools from PT.  She tolerated 2 sets of several exercises for dynamic core control today.  Continue along POC.    Comorbidities possibly endo, legally blind, sezures, cognitive impairments    PT Frequency 1x / week    PT Duration 4 weeks    PT Treatment/Interventions ADLs/Self Care Home Management;Moist Heat;Therapeutic exercise;Neuromuscular re-education;Patient/family education;Manual techniques;Taping    PT Next Visit Plan finalize and create more ind with HEP with focus on central core control within ther ex  PT Home Exercise Plan  [id number]    Consulted and Agree with Plan of Care Patient             Patient will benefit from skilled therapeutic intervention in order to improve the following deficits and impairments:     Visit Diagnosis: Muscle weakness (generalized)  Cramp and spasm     Problem List Patient Active Problem List   Diagnosis Date Noted   Pap smear of cervix declined 05/31/2021   Gastroesophageal reflux disease 05/31/2021   Mixed hyperlipidemia 05/26/2021   Elevated TSH 05/26/2021   Development delay 08/31/2020   Chronic migraine w/o aura w/o status migrainosus, not intractable 08/31/2020   Generalized idiopathic epilepsy and epileptic syndromes, not intractable, with status epilepticus (HCC) 02/27/2020   Development disorder, language 02/27/2020   Brain damage    Learning disabilities    Sensory processing difficulty    Short-term memory loss    Cortical visual impairment     Morton Peters, PT 07/13/21 8:42 AM   [facility] [address] [place], [place], [address] Phone: [phone]   Fax:  [phone]  Name: Claire Stewart MRN: [id number] Date of Birth: 06-07-2001

## 2021-07-20 ENCOUNTER — Encounter: Payer: Self-pay | Admitting: Physical Therapy

## 2021-07-20 ENCOUNTER — Ambulatory Visit: Payer: Managed Care, Other (non HMO) | Admitting: Physical Therapy

## 2021-07-20 ENCOUNTER — Other Ambulatory Visit: Payer: Self-pay

## 2021-07-20 DIAGNOSIS — M6281 Muscle weakness (generalized): Secondary | ICD-10-CM

## 2021-07-20 DIAGNOSIS — R252 Cramp and spasm: Secondary | ICD-10-CM

## 2021-07-20 NOTE — Therapy (Signed)
[facility] [address] [place], [place], [address] Phone: [phone]   Fax:  [phone]  Physical Therapy Treatment  Patient Details  Name: Claire Stewart MRN: [id number] Date of Birth: 04-27-2001 Referring Provider (PT): Lanetta Inch MD   Encounter Date: 07/20/2021   PT End of Session - 07/20/21 0805     Visit Number 17    Date for PT Re-Evaluation 08/03/21    Authorization Type cigna managed/MCD secondary    Authorization - Visit Number 17    Progress Note Due on Visit 20    PT Start Time 0803    PT Stop Time 0841    PT Time Calculation (min) 38 min    Activity Tolerance Patient tolerated treatment well    Behavior During Therapy [facility] for tasks assessed/performed             Past Medical History:  Diagnosis Date   ADHD    Autism    mildly   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   Subjective Assessment - 07/20/21 0804     Subjective I had another good week.    Pertinent History possibly endo, legally blind, sezures, cognitive impairments    Patient Stated Goals know how to manage pain; reduce    Currently in Pain? No/denies                               [facility] Adult PT Treatment/Exercise - 07/20/21 0001       Exercises   Exercises Shoulder;Knee/Hip;Lumbar      Neck Exercises: Machines for Strengthening   UBE (Upper Arm Bike) L2 3x3, PT present to discuss progress and symptoms      Neck Exercises: Stretches   Levator  Stretch 2 reps;20 seconds      Lumbar Exercises: Stretches   Other Lumbar Stretch Exercise 3-way seated ball rollouts 5x5" less VC needed today for technique    Other Lumbar Stretch Exercise standing SB stretch in doorframe 3x20" each way      Lumbar Exercises: Aerobic   Recumbent Bike L2 x 3' PT present to discuss symptoms      Lumbar Exercises: Supine   Bridge 20 reps    Bridge Limitations arms crossed, feet on red ball    Other Supine Lumbar Exercises crunch 2 x 5 straight and oblique bil (Pt prefers without ball roll up thighs)    Other Supine Lumbar Exercises beach ball transfers with hip flexion feet to hands 2 x 5 rounds      Knee/Hip Exercises: Seated   Marching Strengthening;Both;20 reps;Weights    Marching Limitations 5lb kbell on each thigh    Sit to Sand 15 reps;without UE support   hold 5lb kbell     Shoulder Exercises: Seated   Horizontal ABduction Strengthening;Both;20 reps;Theraband    Theraband Level (Shoulder Horizontal  ABduction) Level 4 (Blue)    External Rotation Strengthening;Both;Theraband;15 reps    Theraband Level (Shoulder External Rotation) Level 4 (Blue)      Shoulder Exercises: Standing   Row Strengthening;Both;Theraband;20 reps    Theraband Level (Shoulder Row) Level 4 (Blue)    Other Standing Exercises blue tband over doorway lat pulldown x 20 reps bil    Other Standing Exercises wall push up hands on red ball on wall x 10                       PT Short Term Goals - 05/31/21 0817       PT SHORT TERM GOAL #1   Title ind with initial HEP    Time 4    Period Weeks    Status Achieved    Target Date 06/10/21               PT Long Term Goals - 07/06/21 0759       PT LONG TERM GOAL #1   Title Pt will report decreased pain in bil sides by 75% at least 80% of the time.    Status Achieved      PT LONG TERM GOAL #2   Title Pt will be ind with advanced HEP for pain management    Baseline needs intermittent cues for core  control with overlay of trunk challenge and UE ther ex    Status On-going      PT LONG TERM GOAL #3   Title Pt will be able to demonstrate and /or verbalize techniques to correct her posture while studying to help prevent further pain.    Status Achieved      PT LONG TERM GOAL #4   Title Pt will demonstrate improved core strength with MMT of bil hip flexors and by demonstrating improved standing posture.    Baseline substitutes slightly with trunk lean    Status On-going                   Plan - 07/20/21 0811     Clinical Impression Statement PT encouraged Pt to be more self-led through ther ex today to demo more ind with memory and technique for ther ex today.  She needed min cueing for stretch positions for neck and ball rollouts but otherwise demo'd good ind with HEP.  PT progressed all ther ex with tband to blue today and sent Pt home with blue tband (she has been using red and green but has demo'd good form with increased resistance in clinic.)  She had another good week reporting little to no pain using her stretches and HEP for postural strength.  She is more aware of faulty posture at home for school.  She will be ready for d/c next visit.    Comorbidities possibly endo, legally blind, sezures, cognitive impairments    Rehab Potential Excellent    PT Frequency 1x / week    PT Duration 4 weeks    PT Treatment/Interventions ADLs/Self Care Home Management;Moist Heat;Therapeutic exercise;Neuromuscular re-education;Patient/family education;Manual techniques;Taping    PT Next Visit Plan d/c next time and finalize HEP    PT Home Exercise Plan [id number]    Consulted and Agree with Plan of Care Patient             Patient will benefit from skilled therapeutic intervention in order to improve the following deficits and impairments:     Visit Diagnosis: Muscle weakness (generalized)  Cramp and spasm  Problem List Patient Active Problem List   Diagnosis Date Noted    Pap smear of cervix declined 05/31/2021   Gastroesophageal reflux disease 05/31/2021   Mixed hyperlipidemia 05/26/2021   Elevated TSH 05/26/2021   Development delay 08/31/2020   Chronic migraine w/o aura w/o status migrainosus, not intractable 08/31/2020   Generalized idiopathic epilepsy and epileptic syndromes, not intractable, with status epilepticus (HCC) 02/27/2020   Development disorder, language 02/27/2020   Brain damage    Learning disabilities    Sensory processing difficulty    Short-term memory loss    Cortical visual impairment     Morton Peters, PT 07/20/21 8:43 AM   [facility] [address] [place], [place], [address] Phone: [phone]   Fax:  [phone]  Name: Claire Stewart MRN: [id number] Date of Birth: 2001/04/16

## 2021-07-24 ENCOUNTER — Other Ambulatory Visit: Payer: Self-pay | Admitting: Family Medicine

## 2021-07-24 DIAGNOSIS — Z30011 Encounter for initial prescription of contraceptive pills: Secondary | ICD-10-CM

## 2021-07-27 ENCOUNTER — Ambulatory Visit: Payer: Managed Care, Other (non HMO) | Admitting: Physical Therapy

## 2021-07-27 ENCOUNTER — Other Ambulatory Visit: Payer: Self-pay

## 2021-07-27 ENCOUNTER — Encounter: Payer: Self-pay | Admitting: Physical Therapy

## 2021-07-27 DIAGNOSIS — R252 Cramp and spasm: Secondary | ICD-10-CM

## 2021-07-27 DIAGNOSIS — M6281 Muscle weakness (generalized): Secondary | ICD-10-CM | POA: Diagnosis not present

## 2021-07-27 NOTE — Therapy (Signed)
[facility] @ [place], [place], [address] Phone: [phone]   Fax:  [phone]  Physical Therapy Treatment  Patient Details  Name: Claire Stewart MRN: [id number] Date of Birth: May 18, 2001 Referring Provider (PT): Sherlene Shams MD   Encounter Date: 07/27/2021   PT End of Session - 07/27/21 0807     Visit Number 18    Date for PT Re-Evaluation 08/03/21    Authorization Type cigna managed/MCD secondary    Authorization - Visit Number 18    Progress Note Due on Visit 16    PT Start Time 0802    PT Stop Time 0845    PT Time Calculation (min) 43 min    Activity Tolerance Patient tolerated treatment well    Behavior During Therapy [facility] for tasks assessed/performed             Past Medical History:  Diagnosis Date   ADHD    Autism    mildly   Brain damage    "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   Subjective Assessment - 07/27/21 0804     Subjective I have some slight tightness along Rt ribcage but no pain.  I have learned a lot from PT and now use better posture.  I do some of my PT exercises every day and keep using the Wii Fit for more exercise.    Patient is accompained by: Family member   Pt's mom   Pertinent History possibly endo, legally blind, sezures, cognitive impairments    Patient Stated Goals know how to manage pain; reduce    Currently in Pain? No/denies    Pain Frequency Intermittent    Aggravating Factors  overdoing it    Pain Relieving Factors using  good posture and stretching                [facility] PT Assessment - 07/27/21 0001       Assessment   Medical Diagnosis muscles spasm    Referring Provider (PT) Sherlene Shams MD    Onset Date/Surgical Date 02/23/21    Next MD Visit one year    Prior Therapy yes      AROM   Overall AROM Comments full and painfree      Strength   Overall Strength Comments UEs 4+/5 to 5/5, core 4+/5, LEs 5/5 grossly      Flexibility   Hamstrings WNL    Quadriceps WNL                           [facility] Adult PT Treatment/Exercise - 07/27/21 0001       Exercises   Exercises Shoulder;Lumbar;Knee/Hip      Lumbar Exercises: Aerobic   Nustep L6 x 5' PT present to discuss progress      Lumbar Exercises: Seated   Other Seated Lumbar Exercises blue plyo ball hip to hip seated  on blue ball      Lumbar Exercises: Supine   Bridge 20 reps    Bridge Limitations arms crossed, feet on red ball    Other Supine Lumbar Exercises crunch 1 x 5 straight and oblique bil feet on ball    Other Supine Lumbar Exercises transfers with hip flexion feet to hands 2 x 5 rounds      Lumbar Exercises: Quadruped   Other Quadruped Lumbar Exercises bear plank 1x20", 2x30"      Knee/Hip Exercises: Seated   Marching Strengthening;Both;20 reps;Weights    Marching Limitations 5lb kbell on each thigh    Sit to Sand 10 reps;Other (comment)   hold 5lb, concentrate on finding good posture each rep     Shoulder Exercises: Seated   Other Seated Exercises 2-way bil UE raises 3lb 2x5 seated on blue ball      Shoulder Exercises: Standing   Horizontal ABduction Strengthening;Both;15 reps;Theraband    Theraband Level (Shoulder Horizontal ABduction) Level 4 (Blue)    External Rotation Strengthening;15 reps;Theraband    Theraband Level (Shoulder External Rotation) Level 4 (Blue)    Corporate treasurer;Theraband;20 reps    Theraband Level (Shoulder Row) Level 4 (Blue)    Row Limitations 2x10, chest height and  lat pulldown from over door each    Diagonals Strengthening;Both;10 reps;Theraband    Theraband Level (Shoulder Diagonals) Level 4 (Blue)    Diagonals Limitations d2 flexion                       PT Short Term Goals - 05/31/21 0817       PT SHORT TERM GOAL #1   Title ind with initial HEP    Time 4    Period Weeks    Status Achieved    Target Date 06/10/21               PT Long Term Goals - 07/27/21 0809       PT LONG TERM GOAL #1   Title Pt will report decreased pain in bil sides by 75% at least 80% of the time.    Status Achieved      PT LONG TERM GOAL #2   Title Pt will be ind with advanced HEP for pain management    Status Achieved      PT LONG TERM GOAL #3   Title Pt will be able to demonstrate and /or verbalize techniques to correct her posture while studying to help prevent further pain.    Status Achieved      PT LONG TERM GOAL #4   Title Pt will demonstrate improved core strength with MMT of bil hip flexors and by demonstrating improved standing posture.    Status Achieved                   Plan - 07/27/21 0810     Clinical Impression Statement Pt has consistently reported no or little pain over last few weeks using HEP and improved posture daily with school at home.  Pt led herself through HEP today without need for cueing demo'ing good ind and readiness for d/c to HEP.  She demos greater excursion with abdominal crunches today and excellent maintenance of good alignment and posture with all standing theraband ther ex.  She was able to progress her bear plank to longer holds and demo'd full plank with excellent form.  She reports she gets intermittent tightness along Rt ribcage which isn't painful and she can  keep it at [place].  Given improved postural awareness and strength and ind with HEP, PT recommends D/C to HEP at this time.  Pt and her mother were in agreement with her readiness.    Comorbidities possibly endo, legally blind, sezures,  cognitive impairments    PT Frequency 1x / week    PT Duration 4 weeks    PT Next Visit Plan d/c next time and finalize HEP    PT Home Exercise Plan [id number]    Consulted and Agree with Plan of Care Patient    Family Member Consulted mother             Patient will benefit from skilled therapeutic intervention in order to improve the following deficits and impairments:     Visit Diagnosis: Muscle weakness (generalized)  Cramp and spasm     Problem List Patient Active Problem List   Diagnosis Date Noted   Pap smear of cervix declined 05/31/2021   Gastroesophageal reflux disease 05/31/2021   Mixed hyperlipidemia 05/26/2021   Elevated TSH 05/26/2021   Development delay 08/31/2020   Chronic migraine w/o aura w/o status migrainosus, not intractable 08/31/2020   Generalized idiopathic epilepsy and epileptic syndromes, not intractable, with status epilepticus ([place]) 02/27/2020   Development disorder, language 02/27/2020   Brain damage    Learning disabilities    Sensory processing difficulty    Short-term memory loss    Cortical visual impairment     PHYSICAL THERAPY DISCHARGE SUMMARY  Visits from Start of Care: 18  Current functional level related to goals / functional outcomes: See above, met all treatment goals, ind with HEP   Remaining deficits: See above   Education / Equipment: HEP  Patient agrees to discharge. Patient goals were met. Patient is being discharged due to meeting the stated rehab goals.  Baruch Merl, PT 07/27/21 10:31 AM  [facility] @ [place], [place], [address] Phone: [phone]   Fax:  [phone]  Name: Claire Stewart MRN: [id number] Date of Birth: 02-Sep-2001

## 2021-08-06 ENCOUNTER — Other Ambulatory Visit: Payer: Self-pay | Admitting: Family Medicine

## 2021-11-08 ENCOUNTER — Other Ambulatory Visit: Payer: Self-pay | Admitting: Family Medicine

## 2021-11-08 DIAGNOSIS — K219 Gastro-esophageal reflux disease without esophagitis: Secondary | ICD-10-CM

## 2021-11-16 IMAGING — US US PELVIS COMPLETE
1 series · 14 of 25 positions shown · non-contrast
Comparison: None.

CLINICAL DATA: Suprapubic abdominal pain

EXAM:
TRANSABDOMINAL ULTRASOUND OF PELVIS
TECHNIQUE: Transabdominal ultrasound examination of the pelvis was performed
including evaluation of the uterus, ovaries, adnexal regions, and
pelvic cul-de-sac.

[Series 1: us pelvis complete · 0.22mm/px · 14 of 97 slices shown]
[im 1/97]
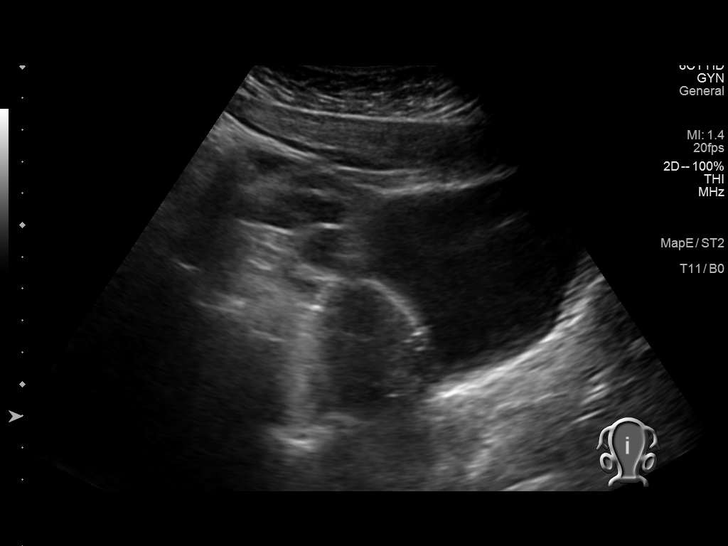
[im 9/97]
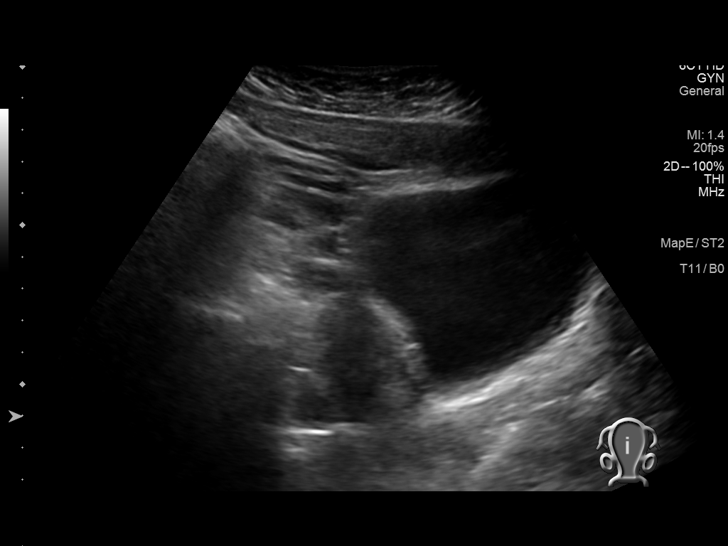
[im 17/97]
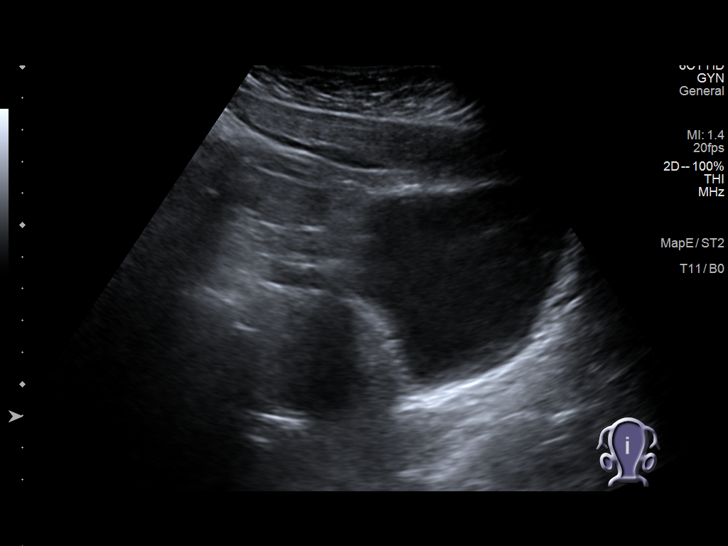
[im 25/97]
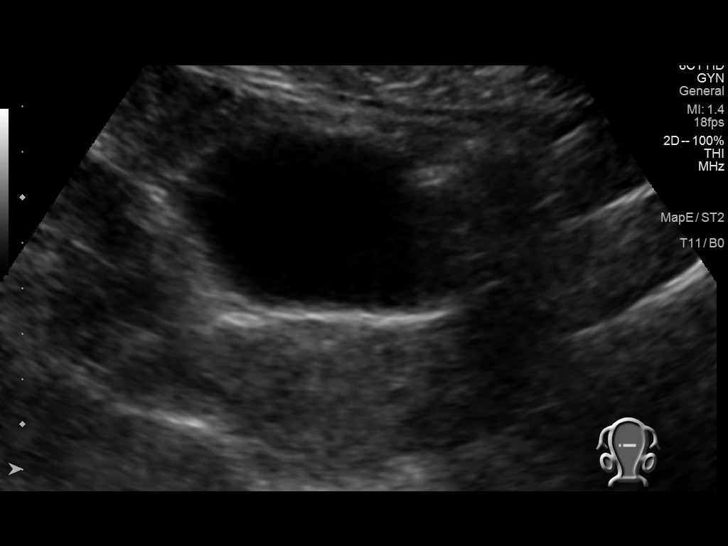
[im 33/97]
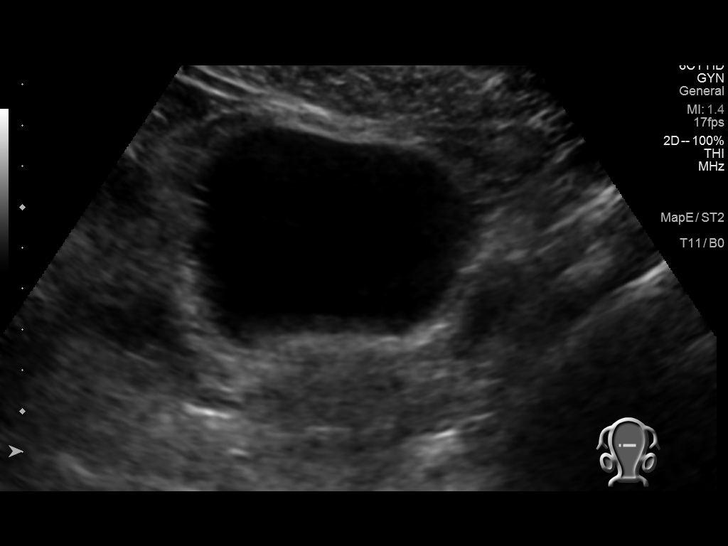
[im 37/97]
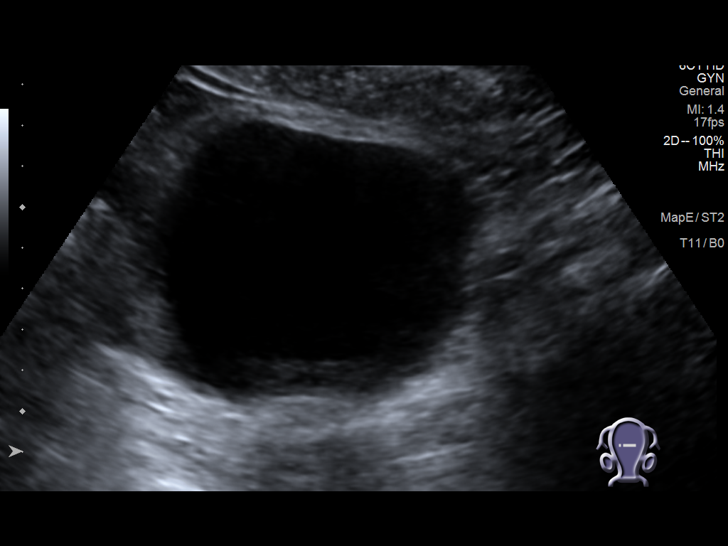
[im 45/97]
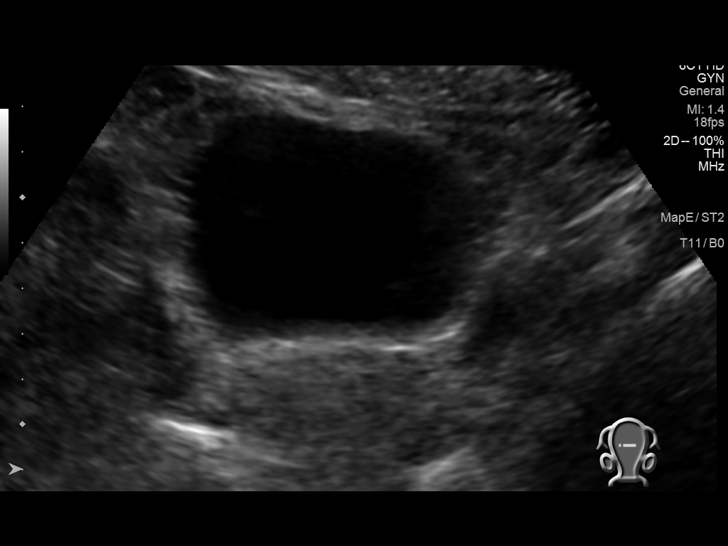
[im 53/97]
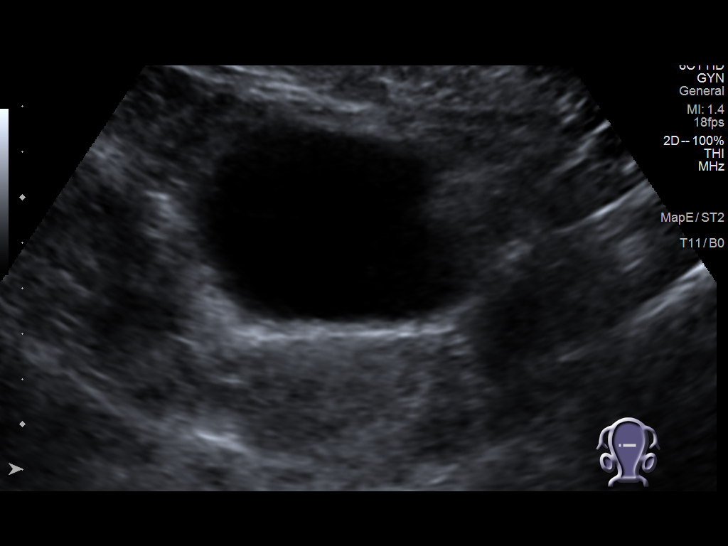
[im 61/97]
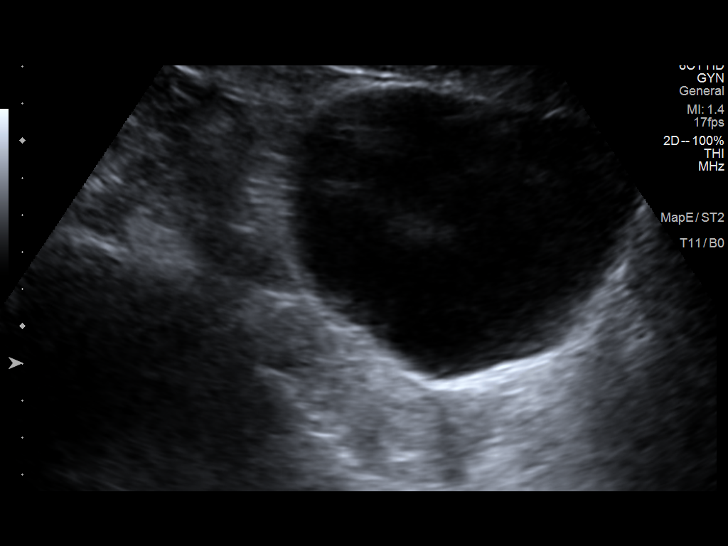
[im 65/97]
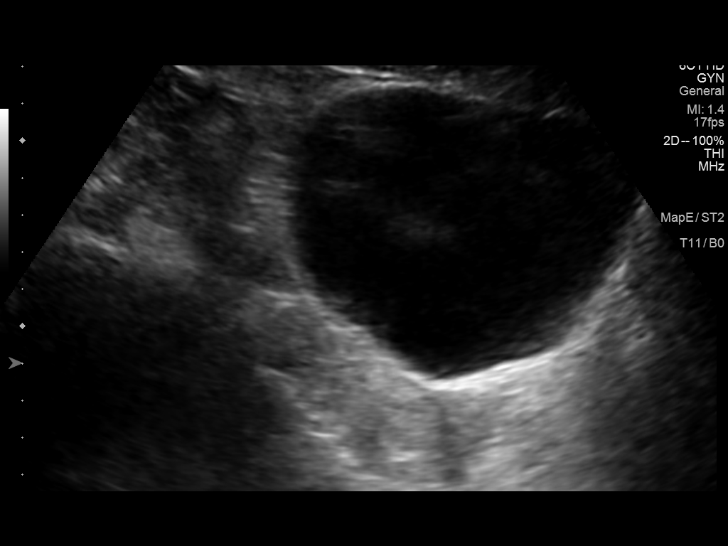
[im 73/97]
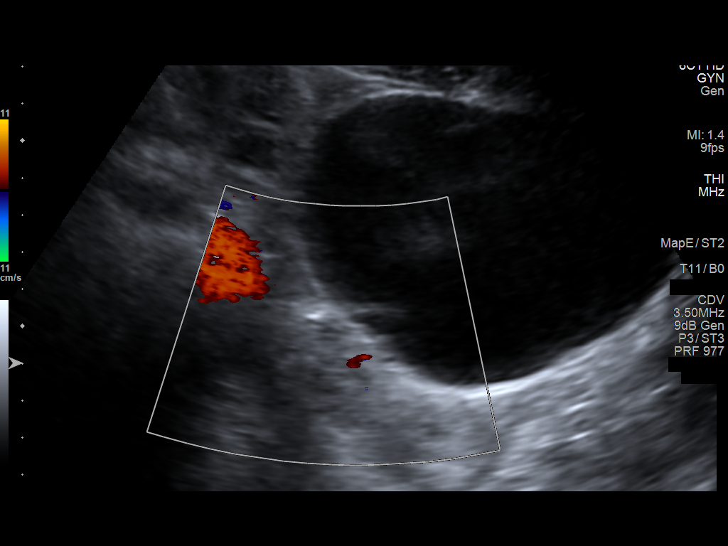
[im 81/97]
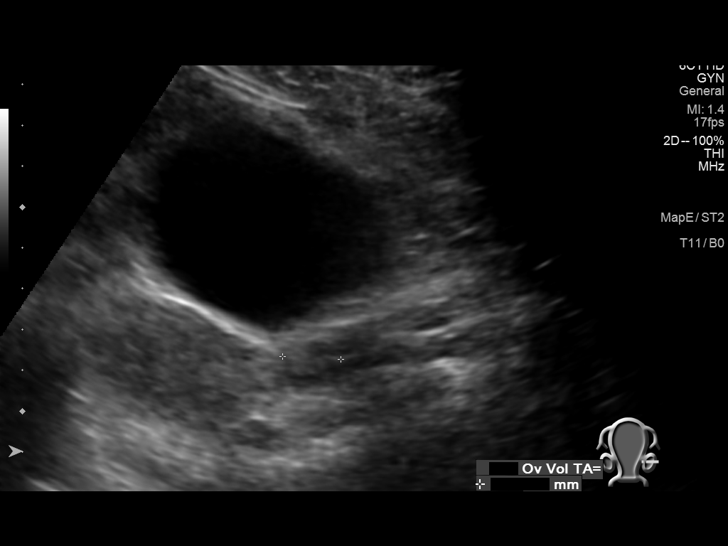
[im 89/97]
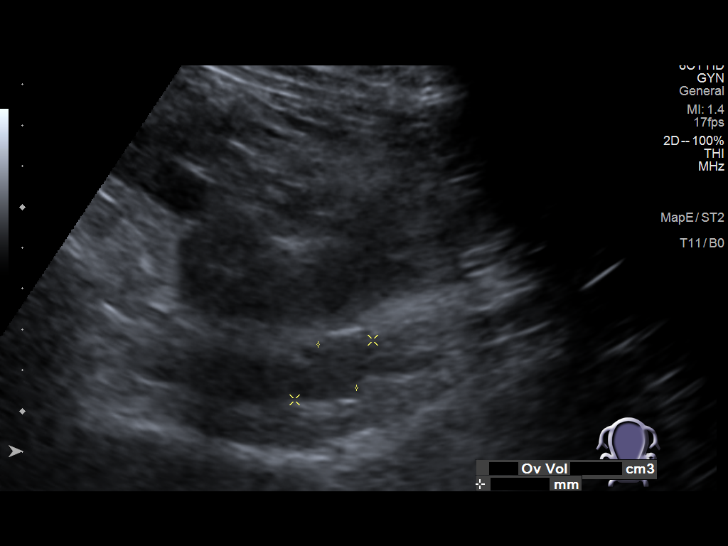
[im 97/97]
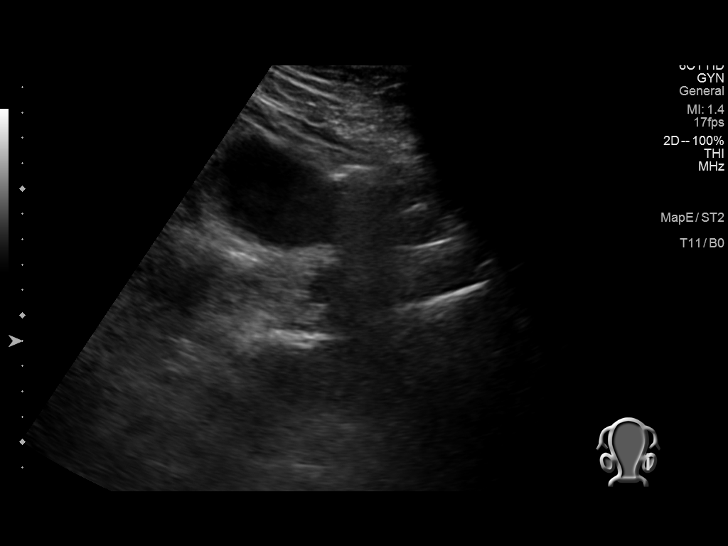

[14 of 25 positions shown; findings below may reference images not displayed]

FINDINGS: Uterus

Measurements: 6.5 x 3.3 x 4.3 cm = volume: 43 mL. Anteverted uterus.
No concerning uterine lesion or visible fibroid.

Endometrium

Thickness: 6.5 mm, non thickened.  No focal abnormality visualized.

Right ovary

Measurements: 2.4 x 1.3 x 1.4 cm = volume: 2.1 cm mL. Normal
appearance/no adnexal mass.

Left ovary

Measurements: 2.4 x 1.4 x 1.4 cm = volume: 2.6 mL. Normal
appearance/no adnexal mass.

Other findings: Trace anechoic free fluid is seen in the deep
pelvis, nonspecific and often physiologic in a reproductive age
female.
IMPRESSION: 1. Trace anechoic free fluid in the deep pelvis, nonspecific and
often physiologic in a reproductive age female.
2. Otherwise unremarkable pelvic ultrasound.

## 2021-12-07 ENCOUNTER — Ambulatory Visit (INDEPENDENT_AMBULATORY_CARE_PROVIDER_SITE_OTHER): Payer: Managed Care, Other (non HMO) | Admitting: Family Medicine

## 2021-12-07 ENCOUNTER — Encounter: Payer: Self-pay | Admitting: Family Medicine

## 2021-12-07 VITALS — BP 135/84 | HR 107 | Temp 98.3°F | Ht 64.0 in | Wt 189.6 lb

## 2021-12-07 DIAGNOSIS — K219 Gastro-esophageal reflux disease without esophagitis: Secondary | ICD-10-CM

## 2021-12-07 DIAGNOSIS — Z30011 Encounter for initial prescription of contraceptive pills: Secondary | ICD-10-CM

## 2021-12-07 DIAGNOSIS — R102 Pelvic and perineal pain: Secondary | ICD-10-CM | POA: Diagnosis not present

## 2021-12-07 MED ORDER — LEVONORGEST-ETH ESTRAD 91-DAY 0.15-0.03 &0.01 MG PO TABS: 1.0000 | ORAL_TABLET | Freq: Every day | ORAL | 4 refills | Status: DC

## 2021-12-07 MED ORDER — OMEPRAZOLE 20 MG PO CPDR: 20.0000 mg | DELAYED_RELEASE_CAPSULE | Freq: Every day | ORAL | 2 refills | Status: DC

## 2021-12-07 NOTE — Progress Notes (Signed)
? ?Assessment & Plan:  ?1-2. Encounter for prescription of oral contraceptives/Pelvic pain ?Stop Yaz.  Start Amethia to regulate her cycle and treat her pelvic pain.  Advised if she starts having an increase in migraines she needs to let me know.  Discussed it may take up to 3 months to regulate her cycle and if she is not having an improvement at that time to let me know. ?- Levonorgestrel-Ethinyl Estradiol (AMETHIA) 0.15-0.03 &0.01 MG tablet; Take 1 tablet by mouth daily.  Dispense: 91 tablet; Refill: 4 ? ?3. Gastroesophageal reflux disease, unspecified whether esophagitis present ?Uncontrolled.  Started Prilosec.  Encouraged patient to keep a log of when she feels she is having trouble swallowing and what she is eating and drinking.  Discussed a referral to a gastroenterologist, but she would like to hold off on this referral at this time. ?- omeprazole (PRILOSEC) 20 MG capsule; Take 1 capsule (20 mg total) by mouth daily.  Dispense: 30 capsule; Refill: 2 ? ? ?Follow up plan: Return for when due for CPE. ? ?Hendricks Limes, MSN, APRN, FNP-C ?[place] ? ?Subjective:  ? ?Patient ID: Claire Stewart, female    DOB: [birth year], 21 y.o.   MRN: [id number] ? ?HPI: ?Claire Stewart is a 21 y.o. female presenting on 12/07/2021 for break through bleeding with [facility] (Since she started the new [facility]) and trouble swallowing (Patient states it has been going on for a few months after she came off her heart burn medication. She has been back on medication x 3 months but still is having heard time swallowing. ) ? ?Patient was started on Yaz in January 2022 with directions to discard inactive pills and start a new pack for continuous use.  She reports she has had breakthrough bleeding the entire time she has been on the medication.  She has been keeping a close eye on it for the past 6 weeks.  In that time she did not have a period for 3 of the months, but the months she did, they lasted 1.5 to 2 weeks each time.   It was recommended by her urogynecologist that she have a birth control with higher estrogen content due to pelvic pain and possible endometriosis.  She does have a history of migraines and reports they did increase with a higher estrogen, but they have been much better over the past 6 months since she got her daith piercings. ? ?Patient is also concerned about difficulty swallowing.  Previously she was started on famotidine which helped this.  She stopped taking it and her symptoms returned.  At that time she felt like her throat was collapsing when she would try to swallow.  She was also having a lot of heartburn.  She resumed the famotidine and things got a lot better, however she still feels like it is not as easy to swallow as it used to be.  Mom reports patient used to be able to swallow multiple large pills at a time without even drinking anything with them. ? ? ?ROS: Negative unless specifically indicated above in HPI.  ? ?Relevant past medical history reviewed and updated as indicated.  ? ?Allergies and medications reviewed and updated. ? ? ?Current Outpatient Medications:  ?  cetirizine (ZYRTEC) 10 MG tablet, TAKE 1 TABLET BY MOUTH EVERY DAY, Disp: 30 tablet, Rfl: 5 ?  cyclobenzaprine (FEXMID) 7.5 MG tablet, TAKE 1 TABLET (7.5 MG TOTAL) BY MOUTH 3 (THREE) TIMES DAILY AS NEEDED FOR MUSCLE SPASMS., Disp: 90 tablet,  Rfl: 11 ?  famotidine (PEPCID) 20 MG tablet, TAKE 1 TABLET BY MOUTH TWICE A DAY, Disp: 180 tablet, Rfl: 1 ?  FIBER PO, Take 1 tablet by mouth daily., Disp: , Rfl:  ?  Levetiracetam 750 MG TB24, Take 4 tablets (3,000 mg total) by mouth daily. Take 4 tablets at night, Disp: 360 tablet, Rfl: 4 ?  magnesium gluconate (MAGONATE) 500 MG tablet, Take 500 mg by mouth daily., Disp: , Rfl:  ?  meloxicam (MOBIC) 7.5 MG tablet, Take 1 tablet (7.5 mg total) by mouth daily as needed for pain., Disp: 60 tablet, Rfl: 5 ?  NIKKI 3-0.02 MG tablet, TAKE 1 TABLET DAILY. DISCARD INACTIVE PILLS AND START A NEW PACK FOR  CONTINUOUS USE., Disp: 84 tablet, Rfl: 2 ?  OVER THE COUNTER MEDICATION, Gummy multi vit, Disp: , Rfl:  ?  OVER THE COUNTER MEDICATION, Gummy digestive advantage, Disp: , Rfl:  ?  PRESCRIPTION MEDICATION, Dialstat, Disp: , Rfl:  ?  prochlorperazine (COMPAZINE) 10 MG tablet, Take 1 tablet (10 mg total) by mouth every 8 (eight) hours as needed for nausea or vomiting., Disp: 30 tablet, Rfl: 6 ?  rizatriptan (MAXALT-MLT) 10 MG disintegrating tablet, Take 1 tablet (10 mg total) by mouth as needed. May repeat in 2 hours if needed, Disp: 12 tablet, Rfl: 11 ?  topiramate (TOPAMAX) 100 MG tablet, Take 1 tablet (100 mg total) by mouth 2 (two) times daily., Disp: 180 tablet, Rfl: 4 ? ?Allergies  ?Allergen Reactions  ? Sulfa Antibiotics Hives  ? Levetiracetam Other (See Comments)  ?  Can not have brand nameStarwood Hotels- caused nerve pain   ? ? ?Objective:  ? ?BP 135/84   Pulse (!) 107   Temp 98.3 ?F (36.8 ?C) (Temporal)   Ht 5\' 4"  (1.626 m)   Wt 189 lb 9.6 oz (86 kg)   SpO2 95%   BMI 32.54 kg/m?   ? ?Physical Exam ?Vitals reviewed.  ?Constitutional:   ?   General: She is not in acute distress. ?   Appearance: Normal appearance. She is obese. She is not ill-appearing, toxic-appearing or diaphoretic.  ?HENT:  ?   Head: Normocephalic and atraumatic.  ?Eyes:  ?   General: No scleral icterus.    ?   Right eye: No discharge.     ?   Left eye: No discharge.  ?   Conjunctiva/sclera: Conjunctivae normal.  ?Cardiovascular:  ?   Rate and Rhythm: Normal rate.  ?Pulmonary:  ?   Effort: Pulmonary effort is normal. No respiratory distress.  ?Musculoskeletal:     ?   General: Normal range of motion.  ?   Cervical back: Normal range of motion.  ?Skin: ?   General: Skin is warm and dry.  ?   Capillary Refill: Capillary refill takes less than 2 seconds.  ?Neurological:  ?   General: No focal deficit present.  ?   Mental Status: She is alert and oriented to person, place, and time. Mental status is at baseline.  ?Psychiatric:     ?   Mood  and Affect: Mood normal.     ?   Behavior: Behavior normal.     ?   Thought Content: Thought content normal.     ?   Judgment: Judgment normal.  ? ? ? ? ? ? ?

## 2021-12-13 ENCOUNTER — Encounter: Payer: Self-pay | Admitting: Family Medicine

## 2022-01-02 ENCOUNTER — Encounter: Payer: Self-pay | Admitting: Family Medicine

## 2022-01-02 DIAGNOSIS — H548 Legal blindness, as defined in USA: Secondary | ICD-10-CM

## 2022-01-02 DIAGNOSIS — H479 Unspecified disorder of visual pathways: Secondary | ICD-10-CM

## 2022-01-12 ENCOUNTER — Other Ambulatory Visit: Payer: Self-pay | Admitting: Neurology

## 2022-01-12 ENCOUNTER — Other Ambulatory Visit: Payer: Self-pay | Admitting: Obstetrics and Gynecology

## 2022-01-12 ENCOUNTER — Other Ambulatory Visit: Payer: Self-pay | Admitting: Family Medicine

## 2022-01-12 DIAGNOSIS — M62838 Other muscle spasm: Secondary | ICD-10-CM

## 2022-01-12 DIAGNOSIS — K219 Gastro-esophageal reflux disease without esophagitis: Secondary | ICD-10-CM

## 2022-01-27 ENCOUNTER — Other Ambulatory Visit: Payer: Self-pay | Admitting: Family Medicine

## 2022-01-27 ENCOUNTER — Other Ambulatory Visit: Payer: Self-pay | Admitting: Obstetrics and Gynecology

## 2022-01-27 DIAGNOSIS — M62838 Other muscle spasm: Secondary | ICD-10-CM

## 2022-03-04 ENCOUNTER — Other Ambulatory Visit: Payer: Self-pay | Admitting: Obstetrics and Gynecology

## 2022-03-04 ENCOUNTER — Other Ambulatory Visit: Payer: Self-pay | Admitting: Family Medicine

## 2022-03-04 DIAGNOSIS — K219 Gastro-esophageal reflux disease without esophagitis: Secondary | ICD-10-CM

## 2022-03-04 DIAGNOSIS — M62838 Other muscle spasm: Secondary | ICD-10-CM

## 2022-04-06 NOTE — Progress Notes (Unsigned)
Patient: Claire Stewart. Fallert Date of Birth: 2001-03-04  Reason for Visit: Follow up History from: Patient, mother Primary Neurologist: Dr. Terrace Arabia   ASSESSMENT AND PLAN 21 y.o. 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 Chronic migraine headaches   -Overall doing well -Try Nurtec 75 mg for acute headache treatment -We will keep other rescue cocktail if Nurtec is not helpful including Maxalt, meloxicam, Compazine -Continue Keppra for seizure prevention -Continue Topamax 100 mg twice a day for migraine preventative, we may consider weaning off this due to possibly contributing to breakthrough bleeding, may consider CGRP for preventative -Follow-up in 6 months or sooner if needed  Meds ordered this encounter  Medications   topiramate (TOPAMAX) 100 MG tablet    Sig: Take 1 tablet (100 mg total) by mouth 2 (two) times daily.    Dispense:  180 tablet    Refill:  4   levETIRAcetam (KEPPRA XR) 750 MG 24 hr tablet    Sig: Take 4 tablets (3,000 mg total) by mouth daily. Take 4 tablets at night    Dispense:  360 tablet    Refill:  4   Rimegepant Sulfate (NURTEC) 75 MG TBDP    Sig: Take 75 mg by mouth as needed (take 1 tablet at onset of headache).    Dispense:  8 tablet    Refill:  11   prochlorperazine (COMPAZINE) 10 MG tablet    Sig: Take 1 tablet (10 mg total) by mouth every 8 (eight) hours as needed for nausea or vomiting.    Dispense:  10 tablet    Refill:  3    HISTORY  Claire Stewart is a 21 year old female, seen in request by her primary care nurse practitioner [name] evaluation of seizure, learning disability, migraine headache, she is accompanied by her mother at today's clinical visit on February 27, 2020   I reviewed and summarized the referring note. She moved from [place] to [place] recently, was previously under the care of neurologist for seizure, migraine headaches   She was born  full-term, but suffered jaundice, at 66 days old, she suffered prolonged seizure, later noted developmental delay, was found to have significant visual impairment, she has been homeschooled,   She was treated with antiepileptic medication as an infant following her initial prolonged seizure, but medicine was tapered off,   By 21 years old, she was noted to have frequent staring off spells, eventually was diagnosed with partial seizure, has been treated with Keppra titrating dose, currently taking extended release 750 mg 4 tablets at nighttime, works well for her, last seizure was on May 12, 2015, she had a sudden onset of staring spells,   She has a long history of migraine headaches, now having 1-3 times each month, taking Topamax 50/100 mg as preventive medication, Imitrex works most of the time, but sometimes her headache is so severe, with significant light noise sensitivity, Imitrex would not work for those prolonged severe migraine headaches   She is legally blind,can read large print only,   UPDATE Aug 31 2020: She is accompanied by her mother at today's clinical visit, She had no recurrent seizure, tolerating Keppra xr 750 mg 4 tablets every night   She continue has frequent migraine headaches, often preceded by blurry vision, about once a week, as to be absent from her home schooling, Maxalt dissolvable only provide limited help, felt Imitrex nasal spray works better for her in the past  We personally reviewed MRI of the brain with and without contrast in July 2021, bilateral occipital, parietal occipital cystic encephalomalacia, gliosis, no acute abnormality   EEG was normal in July 2021   UPDATE April 07 2021: She still does home schooling, overall doing very well, like Keppra XR 500 mg 4 tablets every night, had no recurrent seizure for few years, she is dealing with endometriosis, has frequent lower abdominal pain, she also has occasionally migraine, couple times each month,  Imitrex works well, but she preferred to go back Maxalt dissolvable over Imitrex subcutaneous injection, felt the benefit has not significant difference  Update April 07, 2022 SS: Here with her mom, remains on Keppra XR 750 mg, 4 tablets daily, Topamax 100 mg twice a day.  Takes Maxalt for acute headache.  Migraines improved after daith piercing. Has about 1 migraine a month, can last 1-4 days. Takes Maxalt, Compazine, Meloxicam. Just finished high school.  On new birth control for endometriosis, break through bleeding, no bleeding for 3 months, wonder about weaning off Topamax?   REVIEW OF SYSTEMS: Out of a complete 14 system review of symptoms, the patient complains only of the following symptoms, and all other reviewed systems are negative.  See HPI  ALLERGIES: Allergies  Allergen Reactions   Sulfa Antibiotics Hives   Levetiracetam Other (See Comments)    Can not have brand name- [employer]- caused nerve pain     HOME MEDICATIONS: Outpatient Medications Prior to Visit  Medication Sig Dispense Refill   cetirizine (ZYRTEC) 10 MG tablet TAKE 1 TABLET BY MOUTH EVERY DAY 90 tablet 1   cyclobenzaprine (FEXMID) 7.5 MG tablet TAKE 1 TABLET (7.5 MG TOTAL) BY MOUTH AT BEDTIME AS NEEDED FOR MUSCLE SPASMS. 30 tablet 11   FIBER PO Take 1 tablet by mouth daily.     Levonorgestrel-Ethinyl Estradiol (AMETHIA) 0.15-0.03 &0.01 MG tablet Take 1 tablet by mouth daily. 91 tablet 4   magnesium gluconate (MAGONATE) 500 MG tablet Take 500 mg by mouth daily.     meloxicam (MOBIC) 7.5 MG tablet TAKE 1 TABLET BY MOUTH DAILY AS NEEDED FOR PAIN 60 tablet 5   omeprazole (PRILOSEC) 20 MG capsule TAKE 1 CAPSULE BY MOUTH EVERY DAY 90 capsule 0   OVER THE COUNTER MEDICATION Gummy multi vit     OVER THE COUNTER MEDICATION Gummy digestive advantage     PRESCRIPTION MEDICATION Dialstat     rizatriptan (MAXALT-MLT) 10 MG disintegrating tablet Take 1 tablet (10 mg total) by mouth as needed. May repeat in 2 hours if needed  12 tablet 11   Levetiracetam 750 MG TB24 Take 4 tablets (3,000 mg total) by mouth daily. Take 4 tablets at night 360 tablet 4   prochlorperazine (COMPAZINE) 10 MG tablet Take 1 tablet (10 mg total) by mouth every 8 (eight) hours as needed for nausea or vomiting. 30 tablet 6   topiramate (TOPAMAX) 100 MG tablet Take 1 tablet (100 mg total) by mouth 2 (two) times daily. 180 tablet 4   famotidine (PEPCID) 20 MG tablet TAKE 1 TABLET BY MOUTH TWICE A DAY 180 tablet 1   No facility-administered medications prior to visit.    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"   Cortical visual impairment    Depression    Elevated TSH 05/26/2021   Epilepsy (HCC)  Learning disabilities    global language learning disability   Legally blind    Migraines    Mixed hyperlipidemia 05/26/2021   Sensory processing difficulty    Short-term memory loss     PAST SURGICAL HISTORY: Past Surgical History:  Procedure Laterality Date   MOUTH SURGERY     WISDOM TOOTH EXTRACTION      FAMILY HISTORY: Family History  Problem Relation Age of Onset   Hyperlipidemia Mother    Allergies Mother    ADD / ADHD Mother    Depression Mother    Arthritis Mother    Alcohol abuse Father    Drug abuse Father    Depression Father    Thyroid disease Father    Allergies Brother    ADD / ADHD Brother    Cancer Paternal Aunt    Drug abuse Maternal Grandmother    Diabetes Maternal Grandmother    Bipolar disorder Maternal Grandmother    Seizures Maternal Grandmother    Migraines Maternal Grandmother    Hypertension Maternal Grandfather    Hyperlipidemia Maternal Grandfather    Depression Maternal Grandfather    Arthritis Maternal Grandfather    Breast cancer Cousin     SOCIAL HISTORY: Social History   Socioeconomic History   Marital status: Single    Spouse name: Not on file   Number of children: 0    Years of education: 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   Right-handed.   No daily use of caffeine.   Social Determinants of Health   Financial Resource Strain: Not on file  Food Insecurity: Not on file  Transportation Needs: Not on file  Physical Activity: Not on file  Stress: Not on file  Social Connections: Not on file  Intimate Partner Violence: Not on file   PHYSICAL EXAM  Vitals:   04/07/22 1547  BP: 117/71  Pulse: (!) 101  Weight: 200 lb 6.4 oz (90.9 kg)  Height: 5\' 5"  (1.651 m)   Body mass index is 33.35 kg/m.  Generalized: Well developed, in no acute distress  Neurological examination  Mentation: Alert oriented to time, place, history provided mostly by her mother, cognitive impairment Cranial nerve II-XII: Pupils were equal round reactive to light. Extraocular movements were full, decreased peripheral vision, difficulty with finger count, facial sensation and strength were normal. Head turning and shoulder shrug  were normal and symmetric. Motor: The motor testing reveals 5 over 5 strength of all 4 extremities. Good symmetric motor tone is noted throughout.  Sensory: Sensory testing is intact to soft touch on all 4 extremities. No evidence of extinction is noted.  Coordination: Cerebellar testing reveals good finger-nose-finger and heel-to-shin bilaterally.  Gait and station: Gait is wide-based, cautious, has a walking stick Reflexes: Deep tendon reflexes are symmetric and normal bilaterally.   DIAGNOSTIC DATA (LABS, IMAGING, TESTING) - I reviewed patient records,  labs, notes, testing and imaging myself where available.  Lab Results  Component Value Date   WBC 9.0 05/25/2021   HGB 14.5 05/25/2021   HCT 44.7  05/25/2021   MCV 94 05/25/2021   PLT 318 05/25/2021      Component Value Date/Time   NA 137 05/25/2021 1638   K 4.0 05/25/2021 1638   CL 104 05/25/2021 1638   CO2 17 (L) 05/25/2021 1638   GLUCOSE 88 05/25/2021 1638   BUN 7 05/25/2021 1638   CREATININE 0.80 05/25/2021 1638   CALCIUM 9.5 05/25/2021 1638   PROT 7.6 05/25/2021 1638   ALBUMIN 4.6 05/25/2021 1638   AST 18 05/25/2021 1638   ALT 13 05/25/2021 1638   ALKPHOS 106 05/25/2021 1638   BILITOT 0.2 05/25/2021 1638   GFRNONAA 104 02/27/2020 0941   GFRAA 120 02/27/2020 0941   Lab Results  Component Value Date   CHOL 271 (H) 05/25/2021   HDL 74 05/25/2021   LDLCALC 162 (H) 05/25/2021   TRIG 193 (H) 05/25/2021   CHOLHDL 3.7 05/25/2021   No results found for: "HGBA1C" No results found for: "VITAMINB12" Lab Results  Component Value Date   TSH 6.090 (H) 05/25/2021    Margie Ege, AGNP-C, DNP 04/07/2022, 4:23 PM [facility] [address], [address] [place], [place] [address] [phone]

## 2022-04-07 ENCOUNTER — Ambulatory Visit (INDEPENDENT_AMBULATORY_CARE_PROVIDER_SITE_OTHER): Payer: Managed Care, Other (non HMO) | Admitting: Neurology

## 2022-04-07 ENCOUNTER — Encounter: Payer: Self-pay | Admitting: Neurology

## 2022-04-07 VITALS — BP 117/71 | HR 101 | Ht 65.0 in | Wt 200.4 lb

## 2022-04-07 DIAGNOSIS — G40301 Generalized idiopathic epilepsy and epileptic syndromes, not intractable, with status epilepticus: Secondary | ICD-10-CM

## 2022-04-07 DIAGNOSIS — G43709 Chronic migraine without aura, not intractable, without status migrainosus: Secondary | ICD-10-CM

## 2022-04-07 MED ORDER — LEVETIRACETAM ER 750 MG PO TB24: 3000.0000 mg | ORAL_TABLET | Freq: Every day | ORAL | 4 refills | Status: DC

## 2022-04-07 MED ORDER — TOPIRAMATE 100 MG PO TABS: 100.0000 mg | ORAL_TABLET | Freq: Two times a day (BID) | ORAL | 4 refills | Status: DC

## 2022-04-07 MED ORDER — NURTEC 75 MG PO TBDP: 75.0000 mg | ORAL_TABLET | ORAL | 11 refills | Status: DC | PRN

## 2022-04-07 MED ORDER — PROCHLORPERAZINE MALEATE 10 MG PO TABS
10.0000 mg | ORAL_TABLET | Freq: Three times a day (TID) | ORAL | 3 refills | Status: DC | PRN
Start: 2022-04-07 — End: 2022-10-12

## 2022-04-07 NOTE — Patient Instructions (Signed)
Try the Nurtec for acute headache treatment  Continue other medications

## 2022-04-11 ENCOUNTER — Telehealth: Payer: Self-pay | Admitting: *Deleted

## 2022-04-11 NOTE — Telephone Encounter (Addendum)
PA for Nurtec 75mg  started over the phone w/ plan ([phone]). Unable to complete on covermymeds. Pharmacy coverage through [facility] [place] [phone]). Case (DI#[id number] approved through 04/11/2023.

## 2022-04-21 ENCOUNTER — Encounter: Payer: Self-pay | Admitting: Obstetrics and Gynecology

## 2022-04-21 ENCOUNTER — Ambulatory Visit (INDEPENDENT_AMBULATORY_CARE_PROVIDER_SITE_OTHER): Payer: Managed Care, Other (non HMO) | Admitting: Obstetrics and Gynecology

## 2022-04-21 DIAGNOSIS — M62838 Other muscle spasm: Secondary | ICD-10-CM | POA: Diagnosis not present

## 2022-04-21 MED ORDER — CYCLOBENZAPRINE HCL 7.5 MG PO TABS
ORAL_TABLET | ORAL | 11 refills | Status: DC
Start: 2022-04-21 — End: 2022-06-29

## 2022-04-21 NOTE — Progress Notes (Signed)
[facility] Return Visit  SUBJECTIVE  History of Present Illness: Claire Stewart is a 21 y.o. female seen in follow-up for pelvic pain/ muscle spasm.   Pain has been better. Still having pain especially when sitting down for longer periods. Occurs in her lower abdomen. Has been doing PT exercises and using a handheld massager and that helps a lot. Taking cyclobenzaprine 7.5mg  twice a day. Sometimes she takes it a 3rd time if she is more active.    With Yaz, she had breakthrough bleeding and she switched to Amethia and now does not have breakthrough bleeding. Pharmacist discussed that topiramate may reduce effectiveness which is why breakthrough bleeding may be there. Pain has been better now that the bleeding is more regular.   Past Medical History: Patient  has a past medical history of ADHD, Autism, Brain damage, Cortical visual impairment, Depression, Elevated TSH (05/26/2021), Epilepsy (HCC), Learning disabilities, Legally blind, Migraines, Mixed hyperlipidemia (05/26/2021), Sensory processing difficulty, and Short-term memory loss.   Past Surgical History: She  has a past surgical history that includes Wisdom tooth extraction and Mouth surgery.   Medications: She has a current medication list which includes the following prescription(s): cetirizine, cyclobenzaprine, famotidine, fiber, levetiracetam, levonorgestrel-ethinyl estradiol, magnesium gluconate, meloxicam, omeprazole, OVER THE COUNTER MEDICATION, OVER THE COUNTER MEDICATION, PRESCRIPTION MEDICATION, prochlorperazine, nurtec, rizatriptan, and topiramate.   Allergies: Patient is allergic to sulfa antibiotics and levetiracetam.   Social History: Patient  reports that she has never smoked. She has never used smokeless tobacco. She reports that she does not drink alcohol and does not use drugs.      OBJECTIVE     Physical Exam: Vitals:   04/21/22 1552  BP: 123/85  Pulse: (!) 112    Gen: No apparent distress,  A&O x 3.    ASSESSMENT AND PLAN    Ms. Lupien is a 21 y.o. with:  No diagnosis found.   - Continue with flexeril 7.5mg  2-3 per day. Refills provided. We discussed if PCP is comfortable, then they can provide refills in the future.  - Continue with PT exercises, massage and activity to reduce muscle pain.  - We reviewed that if pain becomes unmanageable, then should consider further workup for endometriosis with general GYN or MIGS specialist. At this time, she feels the discomfort she is having is well managed.  - Reviewed that at age 21, recommendations are to start pap smears/ GYN exams even if she is not sexually active. They will address this with PCP/ FNP and if needed can refer to general GYN.   Follow up 1 year or sooner if needed.   Marguerita Beards, MD

## 2022-05-19 ENCOUNTER — Ambulatory Visit (INDEPENDENT_AMBULATORY_CARE_PROVIDER_SITE_OTHER): Payer: Managed Care, Other (non HMO) | Admitting: Family Medicine

## 2022-05-19 ENCOUNTER — Ambulatory Visit (INDEPENDENT_AMBULATORY_CARE_PROVIDER_SITE_OTHER): Payer: Managed Care, Other (non HMO)

## 2022-05-19 ENCOUNTER — Encounter: Payer: Self-pay | Admitting: Family Medicine

## 2022-05-19 VITALS — BP 114/75 | HR 102 | Temp 98.5°F | Resp 20 | Ht 65.0 in | Wt 200.0 lb

## 2022-05-19 DIAGNOSIS — S93401A Sprain of unspecified ligament of right ankle, initial encounter: Secondary | ICD-10-CM | POA: Diagnosis not present

## 2022-05-19 DIAGNOSIS — M25571 Pain in right ankle and joints of right foot: Secondary | ICD-10-CM

## 2022-05-19 NOTE — Progress Notes (Signed)
Assessment & Plan:  1. Mild sprain of right ankle, initial encounter Education provided on ankle sprain rehab. Encouraged rest, ice, compression, elevation, and Ibuprofen.  2. Acute right ankle pain - DG Ankle Complete Right; Future   Follow up plan: Return if symptoms worsen or fail to improve.  [name], MSN, APRN, FNP-C [facility]  Subjective:   Patient ID: Claire Stewart, female    DOB: 02-04-2001, 21 y.o.   MRN: [id number]  HPI: Claire Stewart is a 21 y.o. female presenting on 05/19/2022 for right ankle pain (Thinks she hurt when on the diving board about 2 weeks ago - pain scale is 8/10./)  Patient is accompanied by her mom who helps provide this history due to autism and short-term memory loss.   Patient complains of injury to the right ankle. This is evaluated as a personal injury. The injury occurred 2 weeks ago, and occurred while diving off the diving board at the pool.  The patient states the ankle started causing pain while in the pool.  She did not hear or sense a pop or snap at the time of the injury. The patient notes pain and mild and severe swelling of the ankle since the injury. She has treated the ankle with ace wrap, heat, and Ibuprofen. Pain is localized to the medial, lateral malleolar area. She has sprained this ankle in the past.   ROS: Negative unless specifically indicated above in HPI.   Relevant past medical history reviewed and updated as indicated.   Allergies and medications reviewed and updated.   Current Outpatient Medications:    cetirizine (ZYRTEC) 10 MG tablet, TAKE 1 TABLET BY MOUTH EVERY DAY, Disp: 90 tablet, Rfl: 1   cyclobenzaprine (FEXMID) 7.5 MG tablet, TAKE 1 TABLET (7.5 MG TOTAL) BY MOUTH AT BEDTIME AS NEEDED FOR MUSCLE SPASMS., Disp: 90 tablet, Rfl: 11   FIBER PO, Take 1 tablet by mouth daily., Disp: , Rfl:    levETIRAcetam (KEPPRA XR) 750 MG 24 hr tablet, Take 4 tablets (3,000 mg total) by mouth daily.  Take 4 tablets at night, Disp: 360 tablet, Rfl: 4   Levonorgestrel-Ethinyl Estradiol (AMETHIA) 0.15-0.03 &0.01 MG tablet, Take 1 tablet by mouth daily., Disp: 91 tablet, Rfl: 4   magnesium gluconate (MAGONATE) 500 MG tablet, Take 500 mg by mouth daily., Disp: , Rfl:    meloxicam (MOBIC) 7.5 MG tablet, TAKE 1 TABLET BY MOUTH DAILY AS NEEDED FOR PAIN, Disp: 60 tablet, Rfl: 5   omeprazole (PRILOSEC) 20 MG capsule, TAKE 1 CAPSULE BY MOUTH EVERY DAY, Disp: 90 capsule, Rfl: 0   OVER THE COUNTER MEDICATION, Gummy multi vit, Disp: , Rfl:    OVER THE COUNTER MEDICATION, Gummy digestive advantage, Disp: , Rfl:    prochlorperazine (COMPAZINE) 10 MG tablet, Take 1 tablet (10 mg total) by mouth every 8 (eight) hours as needed for nausea or vomiting., Disp: 10 tablet, Rfl: 3   Rimegepant Sulfate (NURTEC) 75 MG TBDP, Take 75 mg by mouth as needed (take 1 tablet at onset of headache)., Disp: 8 tablet, Rfl: 11   rizatriptan (MAXALT-MLT) 10 MG disintegrating tablet, Take 1 tablet (10 mg total) by mouth as needed. May repeat in 2 hours if needed, Disp: 12 tablet, Rfl: 11   topiramate (TOPAMAX) 100 MG tablet, Take 1 tablet (100 mg total) by mouth 2 (two) times daily., Disp: 180 tablet, Rfl: 4   PRESCRIPTION MEDICATION, Dialstat (Patient not taking: Reported on 05/19/2022), Disp: , Rfl:   Allergies  Allergen Reactions   Sulfa Antibiotics Hives   Levetiracetam Other (See Comments)    Can not have brand name- [employer]- caused nerve pain     Objective:   BP 114/75   Pulse (!) 102   Temp 98.5 F (36.9 C)   Resp 20   Ht 5\' 5"  (1.651 m)   Wt 200 lb (90.7 kg)   LMP 04/21/2022 (Exact Date)   SpO2 98%   BMI 33.28 kg/m    Physical Exam Vitals reviewed.  Constitutional:      General: She is not in acute distress.    Appearance: Normal appearance. She is not ill-appearing, toxic-appearing or diaphoretic.  HENT:     Head: Normocephalic and atraumatic.  Eyes:     General: No scleral icterus.       Right  eye: No discharge.        Left eye: No discharge.     Conjunctiva/sclera: Conjunctivae normal.  Cardiovascular:     Rate and Rhythm: Normal rate.  Pulmonary:     Effort: Pulmonary effort is normal. No respiratory distress.  Musculoskeletal:        General: Normal range of motion.     Cervical back: Normal range of motion.     Right ankle: Ecchymosis (small anterior to lateral malleolus) present. No swelling, deformity or lacerations. Tenderness present. Normal range of motion. Anterior drawer test negative. Normal pulse.     Right Achilles Tendon: Normal.  Skin:    General: Skin is warm and dry.     Capillary Refill: Capillary refill takes less than 2 seconds.  Neurological:     General: No focal deficit present.     Mental Status: She is alert and oriented to person, place, and time. Mental status is at baseline.  Psychiatric:        Mood and Affect: Mood normal.        Behavior: Behavior normal.        Thought Content: Thought content normal.        Judgment: Judgment normal.

## 2022-05-23 ENCOUNTER — Other Ambulatory Visit: Payer: Self-pay | Admitting: Neurology

## 2022-05-23 ENCOUNTER — Other Ambulatory Visit: Payer: Self-pay | Admitting: Family Medicine

## 2022-05-23 DIAGNOSIS — K219 Gastro-esophageal reflux disease without esophagitis: Secondary | ICD-10-CM

## 2022-05-26 ENCOUNTER — Ambulatory Visit: Payer: Managed Care, Other (non HMO) | Admitting: Family Medicine

## 2022-05-26 ENCOUNTER — Encounter: Payer: Self-pay | Admitting: Family Medicine

## 2022-05-26 ENCOUNTER — Other Ambulatory Visit (HOSPITAL_COMMUNITY)
Admission: RE | Admit: 2022-05-26 | Discharge: 2022-05-26 | Disposition: A | Discharge status: Home / Self Care | Payer: Managed Care, Other (non HMO) | Source: Ambulatory Visit | Attending: Family Medicine | Admitting: Family Medicine

## 2022-05-26 VITALS — BP 115/72 | HR 120 | Temp 98.0°F | Resp 20 | Ht 65.0 in | Wt 207.0 lb

## 2022-05-26 DIAGNOSIS — R5383 Other fatigue: Secondary | ICD-10-CM

## 2022-05-26 DIAGNOSIS — Z0001 Encounter for general adult medical examination with abnormal findings: Secondary | ICD-10-CM

## 2022-05-26 DIAGNOSIS — Z0289 Encounter for other administrative examinations: Secondary | ICD-10-CM

## 2022-05-26 DIAGNOSIS — E782 Mixed hyperlipidemia: Secondary | ICD-10-CM | POA: Diagnosis not present

## 2022-05-26 DIAGNOSIS — R413 Other amnesia: Secondary | ICD-10-CM

## 2022-05-26 DIAGNOSIS — Z1151 Encounter for screening for human papillomavirus (HPV): Secondary | ICD-10-CM | POA: Insufficient documentation

## 2022-05-26 DIAGNOSIS — G43709 Chronic migraine without aura, not intractable, without status migrainosus: Secondary | ICD-10-CM

## 2022-05-26 DIAGNOSIS — K219 Gastro-esophageal reflux disease without esophagitis: Secondary | ICD-10-CM

## 2022-05-26 DIAGNOSIS — Z0000 Encounter for general adult medical examination without abnormal findings: Secondary | ICD-10-CM

## 2022-05-26 DIAGNOSIS — R102 Pelvic and perineal pain: Secondary | ICD-10-CM

## 2022-05-26 DIAGNOSIS — Z113 Encounter for screening for infections with a predominantly sexual mode of transmission: Secondary | ICD-10-CM | POA: Insufficient documentation

## 2022-05-26 DIAGNOSIS — G40301 Generalized idiopathic epilepsy and epileptic syndromes, not intractable, with status epilepticus: Secondary | ICD-10-CM

## 2022-05-26 DIAGNOSIS — Z124 Encounter for screening for malignant neoplasm of cervix: Secondary | ICD-10-CM

## 2022-05-26 DIAGNOSIS — R7989 Other specified abnormal findings of blood chemistry: Secondary | ICD-10-CM

## 2022-05-26 DIAGNOSIS — F88 Other disorders of psychological development: Secondary | ICD-10-CM

## 2022-05-26 DIAGNOSIS — Z30011 Encounter for initial prescription of contraceptive pills: Secondary | ICD-10-CM

## 2022-05-26 DIAGNOSIS — G939 Disorder of brain, unspecified: Secondary | ICD-10-CM

## 2022-05-26 DIAGNOSIS — F809 Developmental disorder of speech and language, unspecified: Secondary | ICD-10-CM

## 2022-05-26 DIAGNOSIS — H479 Unspecified disorder of visual pathways: Secondary | ICD-10-CM

## 2022-05-26 DIAGNOSIS — F819 Developmental disorder of scholastic skills, unspecified: Secondary | ICD-10-CM

## 2022-05-26 MED ORDER — LEVONORGEST-ETH ESTRAD 91-DAY 0.15-0.03 &0.01 MG PO TABS: 1.0000 | ORAL_TABLET | Freq: Every day | ORAL | 4 refills | Status: DC

## 2022-05-26 MED ORDER — CETIRIZINE HCL 10 MG PO TABS: 10.0000 mg | ORAL_TABLET | Freq: Every day | ORAL | 3 refills | Status: DC

## 2022-05-26 MED ORDER — OMEPRAZOLE 20 MG PO CPDR: 20.0000 mg | DELAYED_RELEASE_CAPSULE | Freq: Every day | ORAL | 3 refills | Status: DC

## 2022-05-26 NOTE — Progress Notes (Signed)
Assessment & Plan:  Well adult exam Discussed health benefits of physical activity, and encouraged her to engage in regular exercise appropriate for her age and condition. Preventive health education provided. Declined HPV vaccine, COVID vaccine, influenza vaccine, HIV and Hepatitis C screening  Immunization History  Administered Date(s) Administered   DTaP 05/25/2000, 07/24/2000, 10/04/2000, 07/25/2001, 03/26/2004   HIB (PRP-OMP) 05/25/2000, 07/24/2000, 10/04/2000, 04/09/2001   Hepatitis A 03/29/2005, 01/14/2006   Hepatitis B 09-05-00, 05/25/2000, 10/04/2000   IPV 05/25/2000, 07/24/2000, 10/04/2000, 03/26/2004   MMR 07/25/2001, 03/26/2004   Meningococcal Conjugate 04/06/2011, 04/15/2016   Tdap 04/06/2011, 05/25/2021   Varicella 04/09/2001, 12/06/2006   Health Maintenance  Topic Date Due   PAP SMEAR-Modifier  Never done   PAP-Cervical Cytology Screening  05/31/2022 (Originally 03/24/2021)   COVID-19 Vaccine (1) 06/11/2022 (Originally 09/24/2000)   INFLUENZA VACCINE  12/11/2022 (Originally 04/12/2022)   HPV VACCINES (1 - 2-dose series) 05/27/2023 (Originally 03/25/2011)   Hepatitis C Screening  05/27/2023 (Originally 03/24/2018)   HIV Screening  05/27/2023 (Originally 03/25/2015)   TETANUS/TDAP  05/26/2031   - CBC with Differential/Platelet - CMP14+EGFR - Lipid panel - TSH  2. Screening for cervical cancer - Cytology - PAP  3. Routine screening for STI (sexually transmitted infection) - Cytology - PAP  4. Mixed hyperlipidemia Labs to reassess. Medication not currently indicated. - Lipid panel  5. Elevated TSH Labs to reassess. Not currently on medication. - TSH  6. 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  7. Generalized idiopathic epilepsy and epileptic syndromes, not intractable, with status epilepticus ([place]) Managed by neurology.   8-13. 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 Mom homeschools her.   14. Fatigue, unspecified type - VITAMIN D 25 Hydroxy (Vit-D Deficiency, Fractures)   Follow-up: Return in about 6 months (around 11/24/2022) for follow-up of chronic medication conditions with T. Lilia Pro.   Hendricks Limes, MSN, APRN, FNP-C [facility]  Subjective:  Patient ID: Claire Stewart, female    DOB: Aug 26, 2001  Age: 21 y.o. MRN: [id number]  Patient Care Team: Loman Brooklyn, FNP as PCP - General (Family Medicine)   CC:  Chief Complaint  Patient presents with   Annual Exam    HPI Claire Stewart is a 21 y.o. female who presents today for a complete physical exam. She is accompanied by her mother, who she is okay with being present. She reports consuming a general diet. Home exercise routine includes walking. She generally feels well. She reports sleeping well. She does not have additional problems to discuss today.   Vision:Within last year Dental:Current dental problems  DEPRESSION SCREENING    05/19/2022    3:51 PM 12/07/2021   11:08 AM 05/25/2021    3:56 PM 01/09/2020    3:48 PM  PHQ 2/9 Scores  PHQ - 2 Score 0 0 0 0  PHQ- 9 Score  3 3      Review of Systems  Constitutional:  Positive for malaise/fatigue. Negative for chills, fever and weight loss.  HENT:  Negative for congestion, ear discharge, ear pain, nosebleeds, sinus pain, sore throat and tinnitus.   Eyes:  Negative for pain, discharge and redness.       Legally blind.  Respiratory:  Negative for cough, shortness of breath and wheezing.   Cardiovascular:  Negative for chest pain, palpitations and leg swelling.  Gastrointestinal:  Negative for abdominal pain, constipation, diarrhea, heartburn, nausea and  vomiting.  Genitourinary:  Negative for dysuria, frequency and urgency.  Musculoskeletal:  Positive for joint pain (recent ankle sprain). Negative for myalgias.   Skin:  Negative for rash.  Neurological:  Positive for headaches. Negative for dizziness, seizures and weakness.  Psychiatric/Behavioral:  Negative for depression, substance abuse and suicidal ideas. The patient is not nervous/anxious.      Current Outpatient Medications:    cetirizine (ZYRTEC) 10 MG tablet, TAKE 1 TABLET BY MOUTH EVERY DAY, Disp: 90 tablet, Rfl: 1   cyclobenzaprine (FEXMID) 7.5 MG tablet, TAKE 1 TABLET (7.5 MG TOTAL) BY MOUTH AT BEDTIME AS NEEDED FOR MUSCLE SPASMS., Disp: 90 tablet, Rfl: 11   FIBER PO, Take 1 tablet by mouth daily., Disp: , Rfl:    levETIRAcetam (KEPPRA XR) 750 MG 24 hr tablet, Take 4 tablets (3,000 mg total) by mouth daily. Take 4 tablets at night, Disp: 360 tablet, Rfl: 4   Levonorgestrel-Ethinyl Estradiol (AMETHIA) 0.15-0.03 &0.01 MG tablet, Take 1 tablet by mouth daily., Disp: 91 tablet, Rfl: 4   magnesium gluconate (MAGONATE) 500 MG tablet, Take 500 mg by mouth daily., Disp: , Rfl:    meloxicam (MOBIC) 7.5 MG tablet, TAKE 1 TABLET BY MOUTH DAILY AS NEEDED FOR PAIN, Disp: 60 tablet, Rfl: 5   omeprazole (PRILOSEC) 20 MG capsule, TAKE 1 CAPSULE BY MOUTH EVERY DAY, Disp: 90 capsule, Rfl: 0   OVER THE COUNTER MEDICATION, Gummy multi vit, Disp: , Rfl:    OVER THE COUNTER MEDICATION, Gummy digestive advantage, Disp: , Rfl:    PRESCRIPTION MEDICATION, Dialstat, Disp: , Rfl:    prochlorperazine (COMPAZINE) 10 MG tablet, Take 1 tablet (10 mg total) by mouth every 8 (eight) hours as needed for nausea or vomiting., Disp: 10 tablet, Rfl: 3   Rimegepant Sulfate (NURTEC) 75 MG TBDP, Take 75 mg by mouth as needed (take 1 tablet at onset of headache)., Disp: 8 tablet, Rfl: 11   rizatriptan (MAXALT-MLT) 10 MG disintegrating tablet, TAKE 1 TABLET (10 MG TOTAL) BY MOUTH AS NEEDED. 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"   Cortical visual impairment    Depression    Elevated TSH 05/26/2021   Epilepsy ([place])    Learning disabilities    global language learning disability   Legally blind    Migraines    Mixed hyperlipidemia 05/26/2021   Sensory processing difficulty    Short-term memory loss     Past Surgical History:  Procedure Laterality Date   MOUTH SURGERY     WISDOM TOOTH EXTRACTION      Family History  Problem Relation Age of Onset   Hyperlipidemia Mother    Allergies Mother    ADD / ADHD Mother    Depression Mother    Arthritis Mother    Alcohol abuse Father    Drug abuse Father    Depression Father    Thyroid disease Father    Allergies Brother    ADD / ADHD Brother  Cancer Paternal Aunt    Drug abuse Maternal Grandmother    Diabetes Maternal Grandmother    Bipolar disorder Maternal Grandmother    Seizures Maternal Grandmother    Migraines Maternal Grandmother    Hypertension Maternal Grandfather    Hyperlipidemia Maternal Grandfather    Depression Maternal Grandfather    Arthritis Maternal Grandfather    Breast cancer Cousin     Social History   Socioeconomic History   Marital status: Single    Spouse name: Not on file   Number of children: 0   Years of education: high school 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   Right-handed.   No daily use of caffeine.   Social Determinants of Health   Financial Resource Strain: Not on file  Food Insecurity: Not on file  Transportation Needs: Not on file  Physical Activity: Not on file  Stress: Not on file  Social Connections: Not on file  Intimate Partner Violence: Not on file      Objective:    BP 115/72   Pulse (!) 120   Temp 98 F (36.7 C)   Resp 20   Ht 5' 5"  (1.651 m)   Wt 207 lb (93.9 kg)   LMP 04/21/2022 (Exact Date)   SpO2 96%   BMI 34.45 kg/m   BP Readings from Last 3 Encounters:  05/26/22 115/72  05/19/22 114/75  04/21/22 123/85   Wt Readings from Last 3 Encounters:  05/26/22 207 lb (93.9 kg)  05/19/22 200 lb (90.7 kg)  04/07/22 200 lb 6.4 oz (90.9 kg)    Physical Exam Vitals reviewed. Exam conducted with a chaperone present (J. Bullins).  Constitutional:      General: She is not in acute distress.    Appearance: Normal appearance. She is not ill-appearing, toxic-appearing or diaphoretic.  HENT:     Head: Normocephalic and atraumatic.     Right Ear: Tympanic membrane, ear canal and external ear normal. There is no impacted cerumen.     Left Ear: Tympanic membrane, ear canal and external ear normal. There is no impacted cerumen.     Nose: Nose normal. No congestion or rhinorrhea.     Mouth/Throat:     Mouth: Mucous membranes are moist.     Pharynx: Oropharynx is clear. No oropharyngeal exudate or posterior oropharyngeal erythema.  Eyes:     General: No scleral icterus.       Right eye: No discharge.        Left eye: No discharge.     Conjunctiva/sclera: Conjunctivae normal.     Pupils: Pupils are equal, round, and reactive to light.  Cardiovascular:     Rate and Rhythm: Normal rate and regular rhythm.     Heart sounds: Normal heart sounds. No murmur heard.    No friction rub. No gallop.  Pulmonary:     Effort: Pulmonary effort is normal. No respiratory distress.     Breath sounds: Normal breath sounds.  No stridor. No wheezing, rhonchi or rales.  Chest:  Breasts:  Breasts are symmetrical.     Right: Normal.     Left: Normal.  Abdominal:     General: Abdomen is flat. Bowel sounds are normal. There is no distension.     Palpations: Abdomen is soft. There is no hepatomegaly, splenomegaly or mass.     Tenderness: There is no abdominal tenderness. There is no guarding or rebound.     Hernia: No hernia is present.  Genitourinary:    Pubic Area: No rash or pubic lice.      Labia:        Right: No rash, tenderness, lesion or injury.        Left: No rash, tenderness, lesion or injury.      Vagina: Normal.     Cervix: Friability and erythema present.     Uterus: Normal.      Adnexa: Right adnexa normal and left adnexa normal.  Musculoskeletal:        General: Normal range of motion.     Cervical back: Normal range of motion and neck supple. No rigidity. No muscular tenderness.     Comments: Uses mobility cane.  Lymphadenopathy:     Cervical: No cervical adenopathy.     Upper Body:     Right upper body: No supraclavicular, axillary or pectoral adenopathy.     Left upper body: No supraclavicular, axillary or pectoral adenopathy.  Skin:    General: Skin is warm and dry.     Capillary Refill: Capillary refill takes less than 2 seconds.  Neurological:     General: No focal deficit present.     Mental Status: She is alert and oriented to person, place, and time. Mental status is at baseline.  Psychiatric:        Mood and Affect: Mood normal.        Behavior: Behavior normal.        Thought Content: Thought content normal.        Judgment: Judgment normal.     Lab Results  Component Value Date   TSH 6.090 (H) 05/25/2021   Lab Results  Component Value Date   WBC 9.0 05/25/2021   HGB 14.5 05/25/2021   HCT 44.7 05/25/2021   MCV 94 05/25/2021   PLT 318 05/25/2021   Lab Results  Component Value Date   NA 137 05/25/2021   K 4.0 05/25/2021   CO2 17 (L) 05/25/2021   GLUCOSE 88  05/25/2021   BUN 7 05/25/2021   CREATININE 0.80 05/25/2021   BILITOT 0.2 05/25/2021   ALKPHOS 106 05/25/2021   AST 18 05/25/2021   ALT 13 05/25/2021   PROT 7.6 05/25/2021   ALBUMIN 4.6 05/25/2021   CALCIUM 9.5 05/25/2021   EGFR 107 05/25/2021   Lab Results  Component Value Date   CHOL 271 (H) 05/25/2021   Lab Results  Component Value Date   HDL 74 05/25/2021   Lab Results  Component Value Date   LDLCALC 162 (H) 05/25/2021   Lab Results  Component Value Date   TRIG 193 (H) 05/25/2021   Lab Results  Component Value Date   CHOLHDL 3.7 05/25/2021   No results found for: "HGBA1C"

## 2022-05-27 ENCOUNTER — Encounter: Payer: Self-pay | Admitting: Family Medicine

## 2022-05-27 ENCOUNTER — Other Ambulatory Visit: Payer: Self-pay | Admitting: Family Medicine

## 2022-05-27 DIAGNOSIS — E559 Vitamin D deficiency, unspecified: Secondary | ICD-10-CM | POA: Insufficient documentation

## 2022-05-27 HISTORY — DX: Vitamin D deficiency, unspecified: E55.9

## 2022-05-27 LAB — CBC WITH DIFFERENTIAL/PLATELET
Basophils Absolute: 0.1 10*3/uL (ref 0.0–0.2)
Basos: 1 %
EOS (ABSOLUTE): 0.1 10*3/uL (ref 0.0–0.4)
Eos: 1 %
Hematocrit: 44.2 % (ref 34.0–46.6)
Hemoglobin: 14.9 g/dL (ref 11.1–15.9)
Immature Grans (Abs): 0 10*3/uL (ref 0.0–0.1)
Immature Granulocytes: 0 %
Lymphocytes Absolute: 1.7 10*3/uL (ref 0.7–3.1)
Lymphs: 22 %
MCH: 31.6 pg (ref 26.6–33.0)
MCHC: 33.7 g/dL (ref 31.5–35.7)
MCV: 94 fL (ref 79–97)
Monocytes Absolute: 0.6 10*3/uL (ref 0.1–0.9)
Monocytes: 8 %
Neutrophils Absolute: 5.3 10*3/uL (ref 1.4–7.0)
Neutrophils: 68 %
Platelets: 303 10*3/uL (ref 150–450)
RBC: 4.71 x10E6/uL (ref 3.77–5.28)
RDW: 11.1 % — ABNORMAL LOW (ref 11.7–15.4)
WBC: 7.8 10*3/uL (ref 3.4–10.8)

## 2022-05-27 LAB — CMP14+EGFR
ALT: 13 IU/L (ref 0–32)
AST: 14 IU/L (ref 0–40)
Albumin/Globulin Ratio: 1.4 (ref 1.2–2.2)
Albumin: 4.5 g/dL (ref 4.0–5.0)
Alkaline Phosphatase: 107 IU/L (ref 44–121)
BUN/Creatinine Ratio: 16 (ref 9–23)
BUN: 13 mg/dL (ref 6–20)
Bilirubin Total: 0.4 mg/dL (ref 0.0–1.2)
CO2: 18 mmol/L — ABNORMAL LOW (ref 20–29)
Calcium: 10.1 mg/dL (ref 8.7–10.2)
Chloride: 107 mmol/L — ABNORMAL HIGH (ref 96–106)
Creatinine, Ser: 0.82 mg/dL (ref 0.57–1.00)
Globulin, Total: 3.2 g/dL (ref 1.5–4.5)
Glucose: 95 mg/dL (ref 70–99)
Potassium: 4.8 mmol/L (ref 3.5–5.2)
Sodium: 140 mmol/L (ref 134–144)
Total Protein: 7.7 g/dL (ref 6.0–8.5)
eGFR: 104 mL/min/{1.73_m2} (ref >59)

## 2022-05-27 LAB — LIPID PANEL
Chol/HDL Ratio: 4.8 ratio — ABNORMAL HIGH (ref 0.0–4.4)
Cholesterol, Total: 209 mg/dL — ABNORMAL HIGH (ref 100–199)
HDL: 44 mg/dL (ref >39)
LDL Chol Calc (NIH): 115 mg/dL — ABNORMAL HIGH (ref 0–99)
Triglycerides: 288 mg/dL — ABNORMAL HIGH (ref 0–149)
VLDL Cholesterol Cal: 50 mg/dL — ABNORMAL HIGH (ref 5–40)

## 2022-05-27 LAB — VITAMIN D 25 HYDROXY (VIT D DEFICIENCY, FRACTURES): Vit D, 25-Hydroxy: 19.2 ng/mL — ABNORMAL LOW (ref 30.0–100.0)

## 2022-05-27 LAB — TSH: TSH: 4.55 u[IU]/mL — ABNORMAL HIGH (ref 0.450–4.500)

## 2022-05-27 MED ORDER — VITAMIN D (ERGOCALCIFEROL) 1.25 MG (50000 UNIT) PO CAPS: 50000.0000 [IU] | ORAL_CAPSULE | ORAL | 0 refills | Status: DC

## 2022-06-06 DIAGNOSIS — R87620 Atypical squamous cells of undetermined significance on cytologic smear of vagina (ASC-US): Secondary | ICD-10-CM

## 2022-06-06 HISTORY — DX: Atypical squamous cells of undetermined significance on cytologic smear of vagina (ASC-US): R87.620

## 2022-06-06 LAB — CYTOLOGY - PAP
Chlamydia: NEGATIVE
Comment: NEGATIVE
Comment: NEGATIVE
Comment: NORMAL
Diagnosis: UNDETERMINED — AB
Neisseria Gonorrhea: NEGATIVE
Trichomonas: NEGATIVE

## 2022-06-07 ENCOUNTER — Encounter: Payer: Self-pay | Admitting: Family Medicine

## 2022-06-07 DIAGNOSIS — Z0271 Encounter for disability determination: Secondary | ICD-10-CM

## 2022-06-13 ENCOUNTER — Other Ambulatory Visit: Payer: Self-pay | Admitting: Family Medicine

## 2022-06-13 DIAGNOSIS — Z30011 Encounter for initial prescription of contraceptive pills: Secondary | ICD-10-CM

## 2022-06-13 DIAGNOSIS — E559 Vitamin D deficiency, unspecified: Secondary | ICD-10-CM

## 2022-06-25 ENCOUNTER — Other Ambulatory Visit: Payer: Self-pay | Admitting: Family Medicine

## 2022-06-25 DIAGNOSIS — M62838 Other muscle spasm: Secondary | ICD-10-CM

## 2022-06-27 ENCOUNTER — Encounter: Payer: Self-pay | Admitting: Obstetrics and Gynecology

## 2022-06-27 DIAGNOSIS — M62838 Other muscle spasm: Secondary | ICD-10-CM

## 2022-06-29 MED ORDER — CYCLOBENZAPRINE HCL 7.5 MG PO TABS: 7.5000 mg | ORAL_TABLET | Freq: Three times a day (TID) | ORAL | 11 refills | Status: DC | PRN

## 2022-07-22 ENCOUNTER — Other Ambulatory Visit: Payer: Self-pay | Admitting: Family Medicine

## 2022-07-22 DIAGNOSIS — E559 Vitamin D deficiency, unspecified: Secondary | ICD-10-CM

## 2022-09-15 ENCOUNTER — Encounter: Payer: Self-pay | Admitting: Family Medicine

## 2022-09-15 ENCOUNTER — Telehealth (INDEPENDENT_AMBULATORY_CARE_PROVIDER_SITE_OTHER): Payer: Managed Care, Other (non HMO) | Admitting: Family Medicine

## 2022-09-15 DIAGNOSIS — U071 COVID-19: Secondary | ICD-10-CM | POA: Diagnosis not present

## 2022-09-15 DIAGNOSIS — J069 Acute upper respiratory infection, unspecified: Secondary | ICD-10-CM

## 2022-09-15 NOTE — Progress Notes (Signed)
Virtual Visit via MyChart Video Note Due to COVID-19 pandemic this visit was conducted virtually. This visit type was conducted due to national recommendations for restrictions regarding the COVID-19 Pandemic (e.g. social distancing, sheltering in place) in an effort to limit this patient's exposure and mitigate transmission in our community. All issues noted in this document were discussed and addressed.  A physical exam was not performed with this format.   I connected with Claire Stewart and her mother on 09/15/2022 at 1315 by MyChart Video and verified that I am speaking with the correct person using two identifiers. Claire Stewart is currently located at home and mother, patient, and family is currently with them during visit. The provider, Kari Baars, FNP is located in their office at time of visit.  I discussed the limitations, risks, security and privacy concerns of performing an evaluation and management service by virtual visit and the availability of in person appointments. I also discussed with the patient that there may be a patient responsible charge related to this service. The patient expressed understanding and agreed to proceed.  Subjective:  Patient ID: Claire Stewart. Sarnowski, female    DOB: 05-27-2001, 22 y.o.   MRN: [id number]  Chief Complaint:  Covid Positive   HPI: Claire Stewart. [name] is a 22 y.o. female presenting on 09/15/2022 for Covid Positive   Pt reports cough, congestion, fever, chills, and body aches for the last 4-5 days. Tested positive for COVID on Sunday. Has been taking sudafed, Flonase, tylenol, and Mucinex with significant relief of symptoms. Has been staying hydrated. No change in appetite or urine output.     Relevant past medical, surgical, family, and social history reviewed and updated as indicated.  Allergies and medications reviewed and updated.   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 & has been absorbed"   Cortical visual impairment    Depression    Elevated TSH 05/26/2021   Epilepsy ([facility])    Learning disabilities    global language learning disability   Legally blind    Migraines    Mixed hyperlipidemia 05/26/2021   Sensory processing difficulty    Short-term memory loss    Vitamin D deficiency 05/27/2022    Past Surgical History:  Procedure Laterality Date   MOUTH SURGERY     WISDOM TOOTH EXTRACTION      Social History   Socioeconomic History   Marital status: Single    Spouse name: Not on file   Number of children: 0   Years of education: 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   Right-handed.   No daily use of caffeine.   Social Determinants of Health   Financial Resource Strain: Not on file  Food Insecurity: Not on file  Transportation Needs: Not  on file  Physical Activity: Not on file  Stress: Not on file  Social Connections: Not on file  Intimate Partner Violence: Not on file    Outpatient Encounter Medications as of 09/15/2022  Medication Sig   cetirizine (ZYRTEC) 10 MG tablet Take 1 tablet (10 mg total) by mouth daily.   cyclobenzaprine (FEXMID) 7.5 MG tablet Take 1 tablet (7.5 mg total) by mouth 3 (three) times daily as needed for muscle spasms.   FIBER PO Take 1 tablet by mouth daily.   levETIRAcetam (KEPPRA XR) 750 MG 24 hr tablet Take 4 tablets (3,000 mg total) by mouth daily. Take  4 tablets at night   Levonorgestrel-Ethinyl Estradiol (AMETHIA) 0.15-0.03 &0.01 MG tablet Take 1 tablet by mouth daily.   magnesium gluconate (MAGONATE) 500 MG tablet Take 500 mg by mouth daily.   meloxicam (MOBIC) 7.5 MG tablet TAKE 1 TABLET BY MOUTH DAILY AS NEEDED FOR PAIN   omeprazole (PRILOSEC) 20 MG capsule Take 1 capsule (20 mg total) by mouth daily.   OVER THE COUNTER MEDICATION Gummy multi vit   OVER THE COUNTER MEDICATION Gummy digestive advantage   PRESCRIPTION MEDICATION Dialstat   prochlorperazine (COMPAZINE) 10 MG tablet Take 1 tablet (10 mg total) by mouth every 8 (eight) hours as needed for nausea or vomiting.   Rimegepant Sulfate (NURTEC) 75 MG TBDP Take 75 mg by mouth as needed (take 1 tablet at onset of headache).   rizatriptan (MAXALT-MLT) 10 MG disintegrating tablet TAKE 1 TABLET (10 MG TOTAL) BY MOUTH AS NEEDED. MAY REPEAT IN 2 HOURS IF NEEDED   topiramate (TOPAMAX) 100 MG tablet Take 1 tablet (100 mg total) by mouth 2 (two) times daily.   Vitamin D, Ergocalciferol, (DRISDOL) 1.25 MG (50000 UNIT) CAPS capsule TAKE 1 CAPSULE (50,000 UNITS TOTAL) BY MOUTH EVERY 7 (SEVEN) DAYS   No facility-administered encounter medications on file as of 09/15/2022.    Allergies  Allergen Reactions   Sulfa Antibiotics Hives   Levetiracetam Other (See Comments)    Can not have brand name- [employer]- caused nerve pain     Review of Systems  Constitutional:  Positive for activity change, chills, fatigue and fever. Negative for appetite change, diaphoresis and unexpected weight change.  HENT:  Positive for congestion, postnasal drip and rhinorrhea. Negative for dental problem, drooling, ear discharge, ear pain, facial swelling, hearing loss, mouth sores, nosebleeds, sinus pressure, sinus pain, sneezing, sore throat, tinnitus, trouble swallowing and voice change.   Respiratory:  Positive for cough. Negative for apnea, choking, chest tightness, shortness of breath, wheezing and stridor.    Cardiovascular:  Negative for chest pain, palpitations and leg swelling.  Gastrointestinal:  Negative for abdominal pain, diarrhea, nausea and vomiting.  Genitourinary:  Negative for decreased urine volume and difficulty urinating.  Musculoskeletal:  Positive for myalgias. Negative for arthralgias.  Neurological:  Negative for weakness and headaches.  Psychiatric/Behavioral:  Negative for confusion.   All other systems reviewed and are negative.        Observations/Objective: No vital signs or physical exam, this was a virtual health encounter.  Pt alert and oriented, answers all questions appropriately, and able to Stewart in full sentences.    Assessment and Plan: Stephaine was seen today for covid positive.  Diagnoses and all orders for this visit:  Positive self-administered antigen test for COVID-19 URI with cough and congestion Has been improving with symptomatic care at home, aware to continue. Advised to add Mucinex to regimen. Report new, worsening, or persistent symptoms. Quarantine  guidelines discussed in detail.     Follow Up Instructions: Return if symptoms worsen or fail to improve.    I discussed the assessment and treatment plan with the patient. The patient was provided an opportunity to ask questions and all were answered. The patient agreed with the plan and demonstrated an understanding of the instructions.   The patient was advised to call back or seek an in-person evaluation if the symptoms worsen or if the condition fails to improve as anticipated.  The above assessment and management plan was discussed with the patient. The patient verbalized understanding of and has agreed to the management plan. Patient is aware to call the clinic if they develop any new symptoms or if symptoms persist or worsen. Patient is aware when to return to the clinic for a follow-up visit. Patient educated on when it is appropriate to go to the emergency department.    I provided 12  minutes of time during this MyChart Video encounter.   Monia Pouch, FNP-C [facility] [address] [place], [place] [address] [phone] 09/15/2022

## 2022-10-12 ENCOUNTER — Telehealth: Payer: Managed Care, Other (non HMO) | Admitting: Neurology

## 2022-10-12 DIAGNOSIS — G43709 Chronic migraine without aura, not intractable, without status migrainosus: Secondary | ICD-10-CM | POA: Diagnosis not present

## 2022-10-12 DIAGNOSIS — G40301 Generalized idiopathic epilepsy and epileptic syndromes, not intractable, with status epilepticus: Secondary | ICD-10-CM

## 2022-10-12 MED ORDER — PROCHLORPERAZINE MALEATE 10 MG PO TABS: 10.0000 mg | ORAL_TABLET | Freq: Three times a day (TID) | ORAL | 3 refills | Status: DC | PRN

## 2022-10-12 MED ORDER — RIZATRIPTAN BENZOATE 10 MG PO TBDP: 10.0000 mg | ORAL_TABLET | ORAL | 11 refills | Status: DC | PRN

## 2022-10-12 MED ORDER — LEVETIRACETAM ER 750 MG PO TB24: 3000.0000 mg | ORAL_TABLET | Freq: Every day | ORAL | 4 refills | Status: DC

## 2022-10-12 MED ORDER — MELOXICAM 7.5 MG PO TABS: 7.5000 mg | ORAL_TABLET | Freq: Every day | ORAL | 2 refills | Status: DC | PRN

## 2022-10-12 NOTE — Patient Instructions (Signed)
Try to reduce Topamax by 50 mg every 4 weeks, monitor for increase in headache, let me know, will add an injectable medication such as Ajovy or Emgality

## 2022-10-12 NOTE — Progress Notes (Signed)
Virtual Visit via Video Note  I connected with Claire Stewart on 10/12/22 at  3:45 PM EST by a video enabled telemedicine application and verified that I am speaking with the correct person using two identifiers.  Location: Patient: at her home Provider: in the office    I discussed the limitations of evaluation and management by telemedicine and the availability of in person appointments. The patient expressed understanding and agreed to proceed.  History of Present Illness: Claire Stewart is a 22 year old female, seen in request by her primary care nurse practitioner Hendricks Limes evaluation of seizure, learning disability, migraine headache, she is accompanied by her mother at today's clinical visit on February 27, 2020   I reviewed and summarized the referring note. She moved from [place] to [place] recently, was previously under the care of neurologist for seizure, migraine headaches   She was born full-term, but suffered jaundice, at 55 days old, she suffered prolonged seizure, later noted developmental delay, was found to have significant visual impairment, she has been homeschooled,   She was treated with antiepileptic medication as an infant following her initial prolonged seizure, but medicine was tapered off,   By 22 years old, she was noted to have frequent staring off spells, eventually was diagnosed with partial seizure, has been treated with Keppra titrating dose, currently taking extended release 750 mg 4 tablets at nighttime, works well for her, last seizure was on May 12, 2015, she had a sudden onset of staring spells,   She has a long history of migraine headaches, now having 1-3 times each month, taking Topamax 50/100 mg as preventive medication, Imitrex works most of the time, but sometimes her headache is so severe, with significant light noise sensitivity, Imitrex would not work for those prolonged severe migraine headaches   She is legally blind,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 only,   UPDATE Aug 31 2020: She is accompanied by her mother at today's clinical visit, She had no recurrent seizure, tolerating Keppra xr 750 mg 4 tablets every night   She continue has frequent migraine headaches, often preceded by blurry vision, about once a week, as to be absent from her home schooling, Maxalt dissolvable only provide limited help, felt Imitrex nasal spray works better for her in the past   We personally reviewed MRI of the brain with and without contrast in July 2021, bilateral occipital, parietal occipital cystic encephalomalacia, gliosis, no acute abnormality   EEG was normal in July 2021   UPDATE April 07 2021: She still does home schooling, overall doing very well, like Keppra XR 500 mg 4 tablets every night, had no recurrent seizure for few years, she is dealing with endometriosis, has frequent lower abdominal pain, she also has occasionally migraine, couple times each month, Imitrex works well, but she preferred to go back Maxalt dissolvable over Imitrex subcutaneous injection, felt the benefit has not significant difference   Update April 07, 2022 SS: Here with her mom, remains on Keppra XR 750 mg, 4 tablets daily, Topamax 100 mg twice a day.  Takes Maxalt for acute headache.  Migraines improved after daith piercing. Has about 1 migraine a month, can last 1-4 days. Takes Maxalt, Compazine, Meloxicam. Just finished high school.  On new birth control for endometriosis, break through bleeding, no bleeding for 3 months, wonder about weaning off Topamax?  Update October 12, 2022 SS: Via VV, 1 migraine a month, in Dec/Jan 2 migraines a month due to weather change. Tried Nurtec, it  helped, also has Maxalt, utilizes both for different types of headaches. Continues with breakthrough bleeding around her menstrual cycle. Has tried 3 different birth control. Remains on Keppra XR 750 XR 4 tablets daily. Topamax has been used to migraine prevention.     Observations/Objective: Via virtual visit, is alert and oriented, speech is clear and concise, sitting on couch with pet ferret   Assessment and Plan: 1.  Neonatal seizure, intracranial hemorrhage 2.  Complex partial seizure with secondary generalization 3.  Chronic migraine headaches -Wean down Topamax by 50 mg every month, monitor for increase in headache, reason to wean to see if improvement in menstrual cycle breakthrough bleeding -If headaches increase will add CGRP -Continue Keppra for seizure prevention -Continue migraine rescue cocktail to include Maxalt or Nurtec, meloxicam, Compazine  Meds ordered this encounter  Medications   rizatriptan (MAXALT-MLT) 10 MG disintegrating tablet    Sig: Take 1 tablet (10 mg total) by mouth as needed. May repeat in 2 hours if needed    Dispense:  12 tablet    Refill:  11   levETIRAcetam (KEPPRA XR) 750 MG 24 hr tablet    Sig: Take 4 tablets (3,000 mg total) by mouth daily. Take 4 tablets at night    Dispense:  360 tablet    Refill:  4   prochlorperazine (COMPAZINE) 10 MG tablet    Sig: Take 1 tablet (10 mg total) by mouth every 8 (eight) hours as needed for nausea or vomiting.    Dispense:  10 tablet    Refill:  3   meloxicam (MOBIC) 7.5 MG tablet    Sig: Take 1 tablet (7.5 mg total) by mouth daily as needed. for pain    Dispense:  20 tablet    Refill:  2   Follow Up Instructions: 6 months   I discussed the assessment and treatment plan with the patient. The patient was provided an opportunity to ask questions and all were answered. The patient agreed with the plan and demonstrated an understanding of the instructions.   The patient was advised to call back or seek an in-person evaluation if the symptoms worsen or if the condition fails to improve as anticipated.   Evangeline Dakin, DNP  [facility] [address], [place], [place] [address] [phone]

## 2022-10-21 ENCOUNTER — Ambulatory Visit: Payer: Managed Care, Other (non HMO) | Admitting: Family Medicine

## 2022-10-21 ENCOUNTER — Ambulatory Visit (INDEPENDENT_AMBULATORY_CARE_PROVIDER_SITE_OTHER): Payer: Managed Care, Other (non HMO) | Admitting: Family Medicine

## 2022-10-21 ENCOUNTER — Ambulatory Visit (INDEPENDENT_AMBULATORY_CARE_PROVIDER_SITE_OTHER): Payer: Managed Care, Other (non HMO)

## 2022-10-21 ENCOUNTER — Encounter: Payer: Self-pay | Admitting: Family Medicine

## 2022-10-21 VITALS — BP 113/75 | HR 118 | Temp 98.8°F | Ht 65.0 in | Wt 213.2 lb

## 2022-10-21 DIAGNOSIS — R052 Subacute cough: Secondary | ICD-10-CM

## 2022-10-21 DIAGNOSIS — R0602 Shortness of breath: Secondary | ICD-10-CM | POA: Diagnosis not present

## 2022-10-21 DIAGNOSIS — M940 Chondrocostal junction syndrome [Tietze]: Secondary | ICD-10-CM | POA: Diagnosis not present

## 2022-10-21 MED ORDER — PREDNISONE 20 MG PO TABS: 20.0000 mg | ORAL_TABLET | Freq: Every day | ORAL | 0 refills | Status: AC

## 2022-10-21 NOTE — Progress Notes (Signed)
   Acute Office Visit  Subjective:     Patient ID: Claire Stewart. Lumb, female    DOB: [birth year], 22 y.o.   MRN: [id number]  Chief Complaint  Patient presents with   costochondritis   Here with mother today.   HPI Patient is in today for chest pain since having Covid on January 1st. The pain varies in location- anterior and lateral. It is intermittent. The pain worsens with bending, deep breathing, and coughing. She has also had a cough since January 1st. She reprots cough is worse at night and first thing in the morning. She reports some shortness of breath with activity or bending over. History of bronchitis in past. Denies fever, chills. She has been taking OTC cough syrup as needed.   ROS As per HPI.      Objective:    BP 113/75   Pulse (!) 118   Temp 98.8 F (37.1 C) (Temporal)   Ht 5\' 5"  (1.651 m)   Wt 213 lb 4 oz (96.7 kg)   SpO2 99%   BMI 35.49 kg/m    Physical Exam Vitals and nursing note reviewed.  Constitutional:      General: She is not in acute distress.    Appearance: She is not ill-appearing, toxic-appearing or diaphoretic.  HENT:     Head: Normocephalic and atraumatic.  Cardiovascular:     Rate and Rhythm: Normal rate and regular rhythm.     Heart sounds: Normal heart sounds. No murmur heard. Pulmonary:     Effort: Pulmonary effort is normal. No respiratory distress.     Breath sounds: Normal breath sounds. No wheezing, rhonchi or rales.  Chest:     Chest wall: No tenderness.  Musculoskeletal:     Cervical back: Neck supple. No rigidity.     Right lower leg: No edema.     Left lower leg: No edema.  Skin:    General: Skin is warm and dry.  Neurological:     Mental Status: She is alert and oriented to person, place, and time. Mental status is at baseline.  Psychiatric:        Mood and Affect: Mood normal.     No results found for any visits on 10/21/22.      Assessment & Plan:   Lavaya was seen today for costochondritis.  Diagnoses and all  orders for this visit:  Costochondritis -     predniSONE (DELTASONE) 20 MG tablet; Take 1 tablet (20 mg total) by mouth daily with breakfast for 5 days.  Subacute cough -     DG Chest 2 View; Future -     predniSONE (DELTASONE) 20 MG tablet; Take 1 tablet (20 mg total) by mouth daily with breakfast for 5 days.  Shortness of breath -     DG Chest 2 View; Future  Negative CXR today, agree with radiology reports. Prednisone burst ordered. Discussed symptomatic care and return precautions.    Return if symptoms worsen or fail to improve.  The patient indicates understanding of these issues and agrees with the plan.   Gwenlyn Perking, FNP

## 2022-10-21 NOTE — Patient Instructions (Signed)
Costochondritis  Costochondritis is inflammation of the tissue (cartilage) that connects the ribs to the breastbone (sternum). This causes pain in the front of the chest. The pain usually starts slowly and involves more than one rib. What are the causes? The exact cause of this condition is not always known. It results from stress on the cartilage where your ribs attach to your sternum. The cause of this stress could be: Chest injury. Exercise or activity, such as lifting. Severe coughing. What increases the risk? You are more likely to develop this condition if you: Are female. Are 21-63 years old. Recently started a new exercise or work activity. Have low levels of vitamin D. Have a condition that makes you cough frequently. What are the signs or symptoms? The main symptom of this condition is chest pain. The pain: Usually starts gradually and can be sharp or dull. Gets worse with deep breathing, coughing, or exercise. Gets better with rest. May be worse when you press on the affected area of your ribs and sternum. How is this diagnosed? This condition is diagnosed based on your symptoms, your medical history, and a physical exam. Your health care provider will check for pain when pressing on your sternum. You may also have tests to rule out other causes of chest pain. These may include: A chest X-ray to check for lung problems. An ECG (electrocardiogram) to see if you have a heart problem that could be causing the pain. An imaging scan to rule out a chest or rib fracture. How is this treated? This condition usually goes away on its own over time. Your health care provider may prescribe an NSAID, such as ibuprofen, to reduce pain and inflammation. Treatment may also include: Resting and avoiding activities that make pain worse. Applying heat or ice to the area to reduce pain and inflammation. Doing exercises to stretch your chest muscles. If these treatments do not help, your health  care provider may inject a numbing medicine at the sternum-rib connection to help relieve the pain. Follow these instructions at home: Managing pain, stiffness, and swelling     If directed, put ice on the painful area. To do this: Put ice in a plastic bag. Place a towel between your skin and the bag. Leave the ice on for 20 minutes, 2-3 times a day. If directed, apply heat to the affected area as often as told by your health care provider. Use the heat source that your health care provider recommends, such as a moist heat pack or a heating pad. Place a towel between your skin and the heat source. Leave the heat on for 20-30 minutes. Remove the heat if your skin turns bright red. This is especially important if you are unable to feel pain, heat, or cold. You may have a greater risk of getting burned. Activity Rest as told by your health care provider. Avoid activities that make pain worse. This includes any activities that use chest, abdominal, and side muscles. Do not lift anything that is heavier than 10 lb (4.5 kg), or the limit that you are told, until your health care provider says that it is safe. Return to your normal activities as told by your health care provider. Ask your health care provider what activities are safe for you. General instructions Take over-the-counter and prescription medicines only as told by your health care provider. Keep all follow-up visits as told by your health care provider. This is important. Contact a health care provider if: You have chills  or a fever. Your pain does not go away or it gets worse. You have a cough that does not go away. Get help right away if: You have shortness of breath. You have severe chest pain that is not relieved by medicines, heat, or ice. These symptoms may represent a serious problem that is an emergency. Do not wait to see if the symptoms will go away. Get medical help right away. Call your local emergency services (911 in  the [place] Do not drive yourself to the hospital.  Summary Costochondritis is inflammation of the tissue (cartilage) that connects the ribs to the breastbone (sternum). This condition causes pain in the front of the chest. Costochondritis results from stress on the cartilage where your ribs attach to your sternum. Treatment may include medicines, rest, heat or ice, and exercises. This information is not intended to replace advice given to you by your health care provider. Make sure you discuss any questions you have with your health care provider. Document Revised: 11/16/2021 Document Reviewed: 07/12/2019 Elsevier Patient Education  [place].

## 2022-10-28 ENCOUNTER — Ambulatory Visit (INDEPENDENT_AMBULATORY_CARE_PROVIDER_SITE_OTHER): Payer: Managed Care, Other (non HMO) | Admitting: Nurse Practitioner

## 2022-10-28 ENCOUNTER — Encounter: Payer: Self-pay | Admitting: Nurse Practitioner

## 2022-10-28 VITALS — BP 127/86 | HR 112 | Temp 98.3°F | Resp 20 | Ht 65.0 in | Wt 216.0 lb

## 2022-10-28 DIAGNOSIS — R000 Tachycardia, unspecified: Secondary | ICD-10-CM | POA: Diagnosis not present

## 2022-10-28 DIAGNOSIS — R0782 Intercostal pain: Secondary | ICD-10-CM

## 2022-10-28 MED ORDER — NAPROXEN 500 MG PO TABS: 500.0000 mg | ORAL_TABLET | Freq: Two times a day (BID) | ORAL | 1 refills | Status: DC

## 2022-10-28 NOTE — Progress Notes (Signed)
Subjective:    Patient ID: Claire Stewart, female    DOB: 2001/01/11, 22 y.o.   MRN: [id number]   Chief Complaint: chest pain  Chest Pain  Associated symptoms include a cough and shortness of breath (only if exercising hard).    Patient was seen on 10/21/22 with chest  pain. Was dx with costochondritis and was given steroids. She got some better on steroids but returned once stopped taking. She has been complaining of chest pain since she had covid several months ago. Walking or bending over increases chest pain. Feels out of breath at times. Describes pain as stabbing pain at times and times fells like something is sitting on her chest.  Patient tracks her heart rate at home and she ranges from 80's-120. Even when resting sometimes her heart rate will be in 110's Review of Systems  Constitutional: Negative.   HENT: Negative.    Respiratory:  Positive for cough and shortness of breath (only if exercising hard).   Cardiovascular:  Positive for chest pain.       Objective:   Physical Exam Vitals and nursing note reviewed.  Constitutional:      General: She is not in acute distress.    Appearance: Normal appearance. She is well-developed.  Neck:     Vascular: No carotid bruit or JVD.  Cardiovascular:     Rate and Rhythm: Normal rate and regular rhythm.     Heart sounds: Normal heart sounds.  Pulmonary:     Effort: Pulmonary effort is normal. No respiratory distress.     Breath sounds: Normal breath sounds. No wheezing or rales.  Chest:     Chest wall: No tenderness.  Abdominal:     General: Bowel sounds are normal. There is no distension or abdominal bruit.     Palpations: Abdomen is soft. There is no hepatomegaly, splenomegaly, mass or pulsatile mass.     Tenderness: There is no abdominal tenderness.  Musculoskeletal:        General: Normal range of motion.     Cervical back: Normal range of motion and neck supple.  Lymphadenopathy:     Cervical: No cervical adenopathy.   Skin:    General: Skin is warm and dry.  Neurological:     Mental Status: She is alert and oriented to person, place, and time.     Deep Tendon Reflexes: Reflexes are normal and symmetric.  Psychiatric:        Behavior: Behavior normal.        Thought Content: Thought content normal.        Judgment: Judgment normal.      BP 127/86   Pulse (!) 112   Temp 98.3 F (36.8 C) (Temporal)   Resp 20   Ht 5' 5"$  (1.651 m)   Wt 216 lb (98 kg)   SpO2 98%   BMI 35.94 kg/m   EKG- sinus Merideth Abbey, FNP     Assessment & Plan:   Claire Hilda. Huang in today with chief complaint of Chest Pain   1. Intercostal pain Moist heat to chest wall - EKG 12-Lead - naproxen (NAPROSYN) 500 MG tablet; Take 1 tablet (500 mg total) by mouth 2 (two) times daily with a meal.  Dispense: 60 tablet; Refill: 1  2. Sinus tachycardia Keep up with average heart rate and when it goes up and what she is doing.  - Ambulatory referral to Cardiology    The above assessment and management plan was discussed with the  patient. The patient verbalized understanding of and has agreed to the management plan. Patient is aware to call the clinic if symptoms persist or worsen. Patient is aware when to return to the clinic for a follow-up visit. Patient educated on when it is appropriate to go to the emergency department.   Mary-Margaret Hassell Done, FNP

## 2022-10-28 NOTE — Patient Instructions (Signed)
Sinus Tachycardia  Sinus tachycardia is a fast heartbeat. In sinus tachycardia, the heart beats more than 100 times a minute. Sinus tachycardia starts in the part of the heart called the sinoatrial (SA) node. Sinus tachycardia may be harmless, or it may be a sign of a serious condition. What are the causes? This condition may be caused by: Exercise or exertion. A fever. Pain. Loss of body fluids (dehydration). Severe bleeding (hemorrhage). Anxiety and stress. Certain substances, including: Alcohol. Caffeine. Tobacco and nicotine products. Cold medicines. Illegal drugs. Medical conditions including: Heart disease. An infection. An overactive thyroid (hyperthyroidism). A lack of red blood cells (anemia). What are the signs or symptoms? Symptoms of this condition include: A feeling that the heart is beating fast or unevenly (palpitations). Suddenly noticing your heartbeat (cardiac awareness). Lightheadedness. Tiredness (fatigue). Shortness of breath. Chest pain. Nausea. Fainting. How is this diagnosed? This condition is diagnosed with: A physical exam. Tests or monitoring, such as: Blood tests. An electrocardiogram (ECG). This test measures the electrical activity of the heart. Ambulatory cardiac monitor. This records your heartbeats for 24 hours or more. You may be referred to a heart specialist (cardiologist). How is this treated? Treatment for this condition depends on the cause. Treatment may involve: Treating the underlying condition. Taking new medicines or changing your current medicines as told by your health care provider. Making changes to your diet or lifestyle. Follow these instructions at home: Lifestyle  Do not use any products that contain nicotine or tobacco. These products include cigarettes, chewing tobacco, and vaping devices, such as e-cigarettes. If you need help quitting, ask your health care provider. Do not use illegal drugs, such as  cocaine. Learn relaxation methods to help you when you get stressed or anxious. These include deep breathing. Avoid caffeine or other stimulants, including herbal stimulants that are found in energy drinks. Alcohol use  Do not drink alcohol if: Your health care provider tells you not to drink. You are pregnant, may be pregnant, or are planning to become pregnant. If you drink alcohol: Limit how much you have to: 0-1 drink a day for women. 0-2 drinks a day for men. Know how much alcohol is in your drink. In the [place] one drink equals one 12 oz bottle of beer (355 mL), one 5 oz glass of wine (148 mL), or one 1 oz glass of hard liquor (44 mL). General instructions Drink enough fluids to keep your urine pale yellow. Take over-the-counter and prescription medicines only as told by your health care provider. Ask your health care provider about taking vitamins, herbs, and supplements. Contact a health care provider if: You have vomiting or diarrhea that does not go away. You have a fever. You have weakness or dizziness. You feel faint. Get help right away if: You have pain in your chest, upper arms, jaw, or neck. You have palpitations that do not go away. Summary In sinus tachycardia, the heart beats more than 100 times a minute. Sinus tachycardia may be harmless, or it may be a sign of a serious condition. Treatment for this condition depends on the cause or the underlying condition. Get help right away if you have pain in your chest, upper arms, jaw, or neck. This information is not intended to replace advice given to you by your health care provider. Make sure you discuss any questions you have with your health care provider. Document Revised: 12/28/2021 Document Reviewed: 12/28/2021 Elsevier Patient Education  [place].

## 2022-11-06 ENCOUNTER — Other Ambulatory Visit: Payer: Self-pay | Admitting: Family Medicine

## 2022-11-06 DIAGNOSIS — E559 Vitamin D deficiency, unspecified: Secondary | ICD-10-CM

## 2022-11-17 ENCOUNTER — Encounter: Payer: Self-pay | Admitting: Internal Medicine

## 2022-11-17 ENCOUNTER — Ambulatory Visit: Payer: Managed Care, Other (non HMO) | Attending: Internal Medicine | Admitting: Internal Medicine

## 2022-11-17 VITALS — BP 122/80 | HR 104 | Ht 64.5 in | Wt 213.0 lb

## 2022-11-17 DIAGNOSIS — Q249 Congenital malformation of heart, unspecified: Secondary | ICD-10-CM | POA: Insufficient documentation

## 2022-11-17 DIAGNOSIS — R079 Chest pain, unspecified: Secondary | ICD-10-CM

## 2022-11-17 DIAGNOSIS — R000 Tachycardia, unspecified: Secondary | ICD-10-CM | POA: Diagnosis not present

## 2022-11-17 DIAGNOSIS — I4711 Inappropriate sinus tachycardia, so stated: Secondary | ICD-10-CM | POA: Insufficient documentation

## 2022-11-17 MED ORDER — METOPROLOL TARTRATE 25 MG PO TABS: 25.0000 mg | ORAL_TABLET | Freq: Two times a day (BID) | ORAL | 1 refills | Status: DC

## 2022-11-17 NOTE — Patient Instructions (Addendum)
Medication Instructions:  Your physician has recommended you make the following change in your medication:  Start metoprolol tartrate 25 mg twice daily Continue other medications the same  Labwork: none  Testing/Procedures: Your physician has requested that you have an echocardiogram. Echocardiography is a painless test that uses sound waves to create images of your heart. It provides your doctor with information about the size and shape of your heart and how well your heart's chambers and valves are working. This procedure takes approximately one hour. There are no restrictions for this procedure. Please do NOT wear cologne, perfume, aftershave, or lotions (deodorant is allowed). Please arrive 15 minutes prior to your appointment time.  Follow-Up: Your physician recommends that you schedule a follow-up appointment in: 6 months  Any Other Special Instructions Will Be Listed Below (If Applicable).  If you need a refill on your cardiac medications before your next appointment, please call your pharmacy.

## 2022-11-17 NOTE — Progress Notes (Signed)
Cardiology Office Note  Date: 11/17/2022   ID: Luvenia Starch D. Marnetta, [place] [birth year], MRN [id number]  PCP:  Gwenlyn Perking, FNP  Cardiologist:  Chalmers Guest, MD Electrophysiologist:  None   Reason for Office Visit: Sinus tachycardia evaluation at the request of Hassell Done, FNP   History of Present Illness: Kathi Berrier. Devincentis is a 22 y.o. female known to have ADHD, cortical visual impairment, epilepsy/seizure, legally blind, was referred to cardiology clinic for evaluation of sinus tachycardia. Accompanied by mother.  Patient was diagnosed with costochondritis in 10/2021 for which she was on steroids initially and later switched to naproxen with no relief in the symptoms of chest pain.  She started to have chest pain symptoms after COVID-19 infection in 09/2022, more noticeable since 10/2022. Her resting HR has been more than 100 bpm, EKG today shows sinus tachycardia. Denies palpitations but has sometimes dizziness always has chest pain.  Sometimes it eases and sometimes it gets worse with leaning forward and with certain activities.  Denies syncope, leg swelling.  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 90 tablet 3   cyclobenzaprine (FEXMID) 7.5 MG tablet Take 1 tablet (7.5 mg total) by mouth 3 (three) times daily as needed for muscle spasms. 90 tablet 11   Dextromethorphan-Menthol (DELSYM COUGH RELIEF MT) Use as directed in the mouth or throat.     FIBER PO Take 1 tablet by mouth daily.     levETIRAcetam (KEPPRA XR) 750 MG 24 hr tablet Take 4 tablets (3,000 mg total) by mouth daily. Take 4 tablets at night 360 tablet 4   Levonorgestrel-Ethinyl Estradiol (AMETHIA) 0.15-0.03 &0.01 MG tablet Take 1 tablet by mouth daily. 91 tablet 4   magnesium gluconate (MAGONATE) 500 MG tablet Take 500 mg by mouth daily.     meloxicam (MOBIC) 7.5 MG tablet Take 1 tablet (7.5 mg total) by mouth daily as needed. for pain 20 tablet 2   naproxen (NAPROSYN) 500 MG tablet Take 1 tablet (500 mg total) by mouth 2 (two) times daily with a meal. 60 tablet 1   omeprazole (PRILOSEC) 20 MG capsule Take 1 capsule (20 mg total) by mouth daily. 90 capsule 3   OVER THE COUNTER MEDICATION Gummy multi vit     OVER THE COUNTER MEDICATION Gummy digestive advantage     PRESCRIPTION MEDICATION Dialstat     prochlorperazine (COMPAZINE) 10 MG tablet Take 1 tablet (10 mg total) by mouth  every 8 (eight) hours as needed for nausea or vomiting. 10 tablet 3   Rimegepant Sulfate (NURTEC) 75 MG TBDP Take 75 mg by mouth as needed (take 1 tablet at onset of headache). 8 tablet 11   rizatriptan (MAXALT-MLT) 10 MG disintegrating tablet Take 1 tablet (10 mg total) by mouth as needed. May repeat in 2 hours if needed 12 tablet 11   topiramate (TOPAMAX) 100 MG tablet Take 1 tablet (100 mg total) by mouth 2 (two) times daily. 180 tablet 4   Vitamin D, Ergocalciferol, (DRISDOL) 1.25 MG (50000 UNIT) CAPS capsule TAKE 1 CAPSULE (50,000 UNITS TOTAL) BY MOUTH EVERY 7 (SEVEN) DAYS 12 capsule 0   No current facility-administered medications for this visit.    Allergies:  Sulfa antibiotics and Levetiracetam   Social History: The patient  reports that she has never smoked. She has never used smokeless tobacco. She reports that she does not drink alcohol and does not use drugs.   Family History: The patient's family history includes ADD / ADHD in her brother and mother; Alcohol abuse in her father; Allergies in her brother and mother; Arthritis in her maternal grandfather and mother; Bipolar disorder in her maternal grandmother; Breast cancer in her cousin; Cancer in her paternal aunt; Depression in her father, maternal grandfather, and mother; Diabetes in her maternal grandmother; Drug abuse in her father and maternal grandmother; Hyperlipidemia in her maternal grandfather and mother; Hypertension in her maternal grandfather; Migraines in her maternal grandmother; Seizures in her maternal grandmother; Thyroid disease in her father.   ROS:  Please see the history of present illness. Otherwise, complete review of systems is positive for none.  All other systems are reviewed and negative.   Physical Exam: VS:  BP 122/80   Pulse (!) 104   Ht 5' 4.5" (1.638 m)   Wt 213 lb (96.6 kg)   SpO2 98%   BMI 36.00 kg/m , BMI Body mass index is 36 kg/m.  Wt Readings from Last 3 Encounters:  11/17/22 213 lb (96.6 kg)  10/28/22 216 lb (98 kg)  10/21/22 213 lb 4 oz (96.7 kg)    General: Patient appears comfortable at rest. HEENT: Conjunctiva and lids normal, oropharynx clear with moist mucosa. Neck: Supple, no elevated JVP or carotid bruits, no thyromegaly. Lungs: Clear to auscultation, nonlabored breathing at rest. Cardiac: Regular rate and rhythm, no S3 or significant systolic murmur, no pericardial rub. Abdomen: Soft, nontender, no hepatomegaly, bowel sounds present, no guarding or rebound. Extremities: No pitting edema, distal pulses 2+. Skin: Warm and dry. Musculoskeletal: No kyphosis. Neuropsychiatric: Alert and oriented x3, affect grossly  appropriate.  ECG:  An ECG dated 11/17/2022 was personally reviewed today and demonstrated:  Sinus tachycardia  Recent Labwork: 05/26/2022: ALT 13; AST 14; BUN 13; Creatinine, Ser 0.82; Hemoglobin 14.9; Platelets 303; Potassium 4.8; Sodium 140; TSH 4.550     Component Value Date/Time   CHOL 209 (H) 05/26/2022 1246   TRIG 288 (H) 05/26/2022 1246   HDL 44 05/26/2022 1246   CHOLHDL 4.8 (H) 05/26/2022 1246   LDLCALC 115 (H) 05/26/2022 1246    Other Studies Reviewed Today:   Assessment and Plan: Patient is a 22 year old F known to have ADHD, epilepsy/seizure, legal blindness was referred to cardiology clinic for evaluation of sinus tachycardia.  # Likely inappropriate sinus tachycardia # Chest pain, multifactorial -Although she started to have chest pain since COVID-19 infection in 09/2022, pain did not get better with NSAIDs and steroids which makes the chest pain from  COVID-19 infection less likely. Orthostatic vitals today in the clinic showed no evidence of orthostatic hypotension and POTS. Patient is symptomatic with sinus tachycardia, with resting HR more than 100 bpm. Symptoms include daily chest pain and occasional dizziness. Start metoprolol tartrate 25 mg twice daily. She might need ivabradine in the future if the heart rate is not controlled. However, if she continues to have chest pain despite adequate heart rate control, she will benefit from treadmill testing and eventually CT cardiac if treadmill testing is abnormal.   # History of hole in the heart # Chest pain -Obtain 2D echocardiogram  I have spent a total of 45 minutes with patient reviewing chart, EKGs, labs and examining patient as well as establishing an assessment and plan that was discussed with the patient.  > 50% of time was spent in direct patient care.      Medication Adjustments/Labs and Tests Ordered: Current medicines are reviewed at length with the patient today.  Concerns regarding medicines are outlined  above.   Tests Ordered: Orders Placed This Encounter  Procedures   EKG 12-Lead   ECHOCARDIOGRAM COMPLETE     Medication Changes: No orders of the defined types were placed in this encounter.   Disposition:  Follow up  6 months  Signed Keisean Skowron Fidel Levy, MD, 11/17/2022 10:35 AM    [facility], [place], [place] [address]

## 2022-11-24 ENCOUNTER — Encounter: Payer: Self-pay | Admitting: Family Medicine

## 2022-11-24 ENCOUNTER — Ambulatory Visit (INDEPENDENT_AMBULATORY_CARE_PROVIDER_SITE_OTHER): Payer: Managed Care, Other (non HMO) | Admitting: Family Medicine

## 2022-11-24 VITALS — BP 117/67 | HR 92 | Temp 98.4°F | Ht 64.5 in | Wt 218.0 lb

## 2022-11-24 DIAGNOSIS — G40301 Generalized idiopathic epilepsy and epileptic syndromes, not intractable, with status epilepticus: Secondary | ICD-10-CM

## 2022-11-24 DIAGNOSIS — I4711 Inappropriate sinus tachycardia, so stated: Secondary | ICD-10-CM | POA: Diagnosis not present

## 2022-11-24 DIAGNOSIS — F819 Developmental disorder of scholastic skills, unspecified: Secondary | ICD-10-CM

## 2022-11-24 DIAGNOSIS — G43709 Chronic migraine without aura, not intractable, without status migrainosus: Secondary | ICD-10-CM | POA: Diagnosis not present

## 2022-11-24 DIAGNOSIS — R7989 Other specified abnormal findings of blood chemistry: Secondary | ICD-10-CM

## 2022-11-24 DIAGNOSIS — K219 Gastro-esophageal reflux disease without esophagitis: Secondary | ICD-10-CM | POA: Diagnosis not present

## 2022-11-24 DIAGNOSIS — F809 Developmental disorder of speech and language, unspecified: Secondary | ICD-10-CM

## 2022-11-24 DIAGNOSIS — Z30011 Encounter for initial prescription of contraceptive pills: Secondary | ICD-10-CM | POA: Diagnosis not present

## 2022-11-24 MED ORDER — NORETHIN ACE-ETH ESTRAD-FE 1-20 MG-MCG PO TABS: 1.0000 | ORAL_TABLET | Freq: Every day | ORAL | 3 refills | Status: DC

## 2022-11-24 NOTE — Patient Instructions (Signed)
Take omeprazole every other day for 2 weeks, then 2x a week for 2 weeks, then stop. Call if symptoms worsening.

## 2022-11-24 NOTE — Progress Notes (Signed)
Established Patient Office Visit  Subjective   Patient ID: Jahzelle Knobloch. Thielke, female    DOB: 04-07-2001  Age: 22 y.o. MRN: [id number]  Chief Complaint  Patient presents with   Medical Management of Chronic Issues   Gastroesophageal Reflux    HPI Here with mother today who helped provide history.   GERD She reports GERD is very well controlled. She has been on omeprazole for months now and would like to try coming off of this.  2. Tachycardia Now established with cardiology. Started on metorprolol with good relief of chest pain. She is having periods of time where her HR is elevated at rest. They have an echo scheduled with later this month. Mom is concerned because cardiology did not recommend a long term heart monitor. Mom wold like a second opinion with a cardiologist with [facility], [facility], or [facility].   3. Irregular cycles She has been having irregular cycles recently. She has been on OCPs for endomentrosis. Neurology has been weaning off of topamax to see if this helps with irregular cycles. She would like to go back to on Junel as this seemed to control her cycles better for her with fewer migraines as well. She is established with GYN.   3. Chronic migraines Established with neurology. Reports well controlled right now.   4. Seizure disordered On keppra. No recent seizures. Managed by neurology.        ROS As per HPI.   Objective:     BP 117/67   Pulse 92   Temp 98.4 F (36.9 C) (Temporal)   Ht 5' 4.5" (1.638 m)   Wt 218 lb (98.9 kg)   SpO2 97%   BMI 36.84 kg/m    Physical Exam Vitals and nursing note reviewed.  Constitutional:      General: She is not in acute distress.    Appearance: She is not ill-appearing, toxic-appearing or diaphoretic.  Cardiovascular:     Rate and Rhythm: Normal rate and regular rhythm.     Heart sounds: Normal heart sounds. No murmur heard. Pulmonary:     Effort: Pulmonary effort is normal.     Breath sounds: Normal breath sounds.   Abdominal:     General: Bowel sounds are normal. There is no distension.     Palpations: Abdomen is soft.     Tenderness: There is no abdominal tenderness. There is no guarding or rebound.  Musculoskeletal:     Cervical back: Neck supple. No rigidity.     Right lower leg: No edema.     Left lower leg: No edema.  Skin:    General: Skin is warm and dry.  Neurological:     Mental Status: She is alert and oriented to person, place, and time. Mental status is at baseline.  Psychiatric:        Mood and Affect: Mood normal.        Behavior: Behavior normal.      No results found for any visits on 11/24/22.    The ASCVD Risk score (Arnett DK, et al., 2019) failed to calculate for the following reasons:   The 2019 ASCVD risk score is only valid for ages 21 to 62    Assessment & Plan:   Ilayna was seen today for medical management of chronic issues and gastroesophageal reflux.  Diagnoses and all orders for this visit:  Gastroesophageal reflux disease, unspecified whether esophagitis present Well controlled. She would like to try to wean off of omeprazole. Agree with this plan.  Discussed to notify for return of symptoms.   Inappropriate sinus node tachycardia Normal rate today with metoprolol. Has upcoming echo. Referral for second opinion placed as requested.  -     Ambulatory referral to Cardiology  Encounter for prescription of oral contraceptives Requesting to switch back to [facility] as this controlled her cycles better with fewer side effects.  -     norethindrone-ethinyl estradiol-FE (JUNEL FE 1/20) 1-20 MG-MCG tablet; Take 1 tablet by mouth daily.  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 Weaning off topamax with good control.   Generalized idiopathic epilepsy and epileptic syndromes, not intractable, with status epilepticus ([place]) Managed by neurology on keppra.   Learning disabilities Development disorder,  language  Elevated TSH Will recheck today.  -     TSH -     T4, Free   Return in about 6 months (around 05/27/2023) for CPE with labs.   The patient indicates understanding of these issues and agrees with the plan.   Gwenlyn Perking, FNP

## 2022-11-25 LAB — TSH: TSH: 4.66 u[IU]/mL — ABNORMAL HIGH (ref 0.450–4.500)

## 2022-11-25 LAB — T4, FREE: Free T4: 1.14 ng/dL (ref 0.82–1.77)

## 2022-11-29 ENCOUNTER — Telehealth: Payer: Self-pay

## 2022-11-29 NOTE — Telephone Encounter (Signed)
Received Faxed PA for Nurtex with previous one on file that was approved through July 2024

## 2022-12-07 ENCOUNTER — Other Ambulatory Visit: Payer: Self-pay | Admitting: Nurse Practitioner

## 2022-12-07 DIAGNOSIS — R0782 Intercostal pain: Secondary | ICD-10-CM

## 2022-12-08 ENCOUNTER — Ambulatory Visit: Payer: Managed Care, Other (non HMO) | Attending: Internal Medicine

## 2022-12-08 ENCOUNTER — Telehealth: Payer: Self-pay | Admitting: Internal Medicine

## 2022-12-08 DIAGNOSIS — R079 Chest pain, unspecified: Secondary | ICD-10-CM | POA: Diagnosis not present

## 2022-12-08 NOTE — Telephone Encounter (Signed)
Patients mother states that the patient just had echo done today and wanted to know if she needs to come in for further testing. Informed the mother that Dr. Dellia Cloud will need time to review the results of the Echo and if she feels the patient needs to come in sooner, we will schedule appointment. Mother voice understanding.

## 2022-12-08 NOTE — Telephone Encounter (Signed)
Claire Stewart had echo done in the office today. States that medication she was put on has stopped chest pains. Mother is wanting to know if Dr. Dellia Cloud would see Claire Stewart sooner or order a stress test.

## 2022-12-09 LAB — ECHOCARDIOGRAM COMPLETE
AR max vel: 2.2 cm2
AV Area VTI: 2.72 cm2
AV Area mean vel: 2.57 cm2
AV Mean grad: 3 mmHg
AV Peak grad: 6.3 mmHg
Ao pk vel: 1.25 m/s
Area-P 1/2: 4.49 cm2
Calc EF: 65.4 %
S' Lateral: 2.8 cm
Single Plane A2C EF: 64.3 %
Single Plane A4C EF: 64.2 %

## 2022-12-09 NOTE — Telephone Encounter (Signed)
Left message for patient to call office.  

## 2022-12-12 NOTE — Telephone Encounter (Signed)
Pt returning call

## 2022-12-12 NOTE — Telephone Encounter (Signed)
2nd attempt-left message for patient to call office.

## 2022-12-13 NOTE — Telephone Encounter (Signed)
Mother is wanting to know if the diltiazem is more for resting HR since it ranges from 79-81. States that there is sometimes a spike in HR while sleeping but does not last long. States that HR is elevated more when she stands up. States that if this medication is more for resting HR then they will hold off for now.

## 2022-12-13 NOTE — Telephone Encounter (Signed)
Per DPR, spoke with mother and made her aware. States that she is looking into getting a second opinion since patients heart rate seems to be in the 130's in the night and in the 120's when she stands up. I offered patient an sooner appointment with Finis Bud, NP but patient refused. States that she is going to keep next appointment with Dr. Dellia Cloud.

## 2022-12-14 MED ORDER — DILTIAZEM HCL 30 MG PO TABS: 30.0000 mg | ORAL_TABLET | Freq: Three times a day (TID) | ORAL | 0 refills | Status: AC | PRN

## 2022-12-14 NOTE — Telephone Encounter (Signed)
Mother made aware but wants to know if the medication is something that would be taken every day and if so would she ever be able to come off this medicine or is this something that she would only take as needed. Please advise.

## 2022-12-14 NOTE — Addendum Note (Signed)
Addended by: Sung Amabile on: 12/14/2022 12:49 PM   Modules accepted: Orders

## 2022-12-14 NOTE — Telephone Encounter (Signed)
Mother made aware. States that she is fine with patient taking the diltiazem 30 mg. Prescription sent to [facility] as requested.

## 2023-01-06 ENCOUNTER — Emergency Department (HOSPITAL_COMMUNITY): Payer: Managed Care, Other (non HMO)

## 2023-01-06 ENCOUNTER — Encounter (HOSPITAL_COMMUNITY): Payer: Self-pay

## 2023-01-06 ENCOUNTER — Other Ambulatory Visit: Payer: Self-pay

## 2023-01-06 ENCOUNTER — Emergency Department (HOSPITAL_COMMUNITY)
Admission: EM | Admit: 2023-01-06 | Discharge: 2023-01-06 | Disposition: A | Discharge status: Home / Self Care | Payer: Managed Care, Other (non HMO) | Attending: Emergency Medicine | Admitting: Emergency Medicine

## 2023-01-06 DIAGNOSIS — R0789 Other chest pain: Secondary | ICD-10-CM | POA: Diagnosis not present

## 2023-01-06 DIAGNOSIS — Z8616 Personal history of COVID-19: Secondary | ICD-10-CM | POA: Insufficient documentation

## 2023-01-06 DIAGNOSIS — R000 Tachycardia, unspecified: Secondary | ICD-10-CM

## 2023-01-06 DIAGNOSIS — F840 Autistic disorder: Secondary | ICD-10-CM | POA: Insufficient documentation

## 2023-01-06 DIAGNOSIS — M79602 Pain in left arm: Secondary | ICD-10-CM | POA: Diagnosis present

## 2023-01-06 LAB — D-DIMER, QUANTITATIVE: D-Dimer, Quant: <0.27 ug/mL-FEU (ref 0.00–0.50)

## 2023-01-06 LAB — CBC
HCT: 44.4 % (ref 36.0–46.0)
Hemoglobin: 14.4 g/dL (ref 12.0–15.0)
MCH: 30.6 pg (ref 26.0–34.0)
MCHC: 32.4 g/dL (ref 30.0–36.0)
MCV: 94.5 fL (ref 80.0–100.0)
Platelets: 296 10*3/uL (ref 150–400)
RBC: 4.7 MIL/uL (ref 3.87–5.11)
RDW: 11.3 % — ABNORMAL LOW (ref 11.5–15.5)
WBC: 7.9 10*3/uL (ref 4.0–10.5)
nRBC: 0 % (ref 0.0–0.2)

## 2023-01-06 LAB — BASIC METABOLIC PANEL
Anion gap: 10 (ref 5–15)
BUN: 16 mg/dL (ref 6–20)
CO2: 24 mmol/L (ref 22–32)
Calcium: 9.2 mg/dL (ref 8.9–10.3)
Chloride: 100 mmol/L (ref 98–111)
Creatinine, Ser: 0.88 mg/dL (ref 0.44–1.00)
GFR, Estimated: >60 mL/min (ref >60)
Glucose, Bld: 81 mg/dL (ref 70–99)
Potassium: 3.6 mmol/L (ref 3.5–5.1)
Sodium: 134 mmol/L — ABNORMAL LOW (ref 135–145)

## 2023-01-06 LAB — TROPONIN I (HIGH SENSITIVITY): Troponin I (High Sensitivity): <2 ng/L (ref <18)

## 2023-01-06 NOTE — ED Triage Notes (Addendum)
Pt presents with L anterior wrist pain that started while playing video games. Pt's mother was concerned because pt has recently been treated by cardiology for tachycardia and pt's wrist pain is on the L.

## 2023-01-06 NOTE — Discharge Instructions (Signed)
Thank you for letting us take care of you today.  Overall, your workup is very reassuring including your EKG, chest x-ray, and blood work. Your cardiac enzymes and the test called a D-dimer that we discussed to assess for risk of blood clots are negative.  Please continue taking your medications as prescribed by your outpatient team.  If you continue to have issues with hand pain, please follow-up with your PCP for further evaluation next week.  If you would like a second opinion by cardiology, I have referred you to the office that we have on call today.  You may set up an appointment with them if you so desire.  Otherwise, continue to follow-up with your cardiologist as recommended.  If you develop any new or worsening symptoms such as chest pain, shortness of breath, radiating pain, significant sweating, vomiting, loss of consciousness, or other new, concerning symptoms, please return to the nearest emergency department for reevaluation.

## 2023-01-06 NOTE — ED Provider Notes (Signed)
[place] EMERGENCY DEPARTMENT AT [facility] Provider Note   CSN: [id number] Arrival date & time: 01/06/23  1409     History  Chief Complaint  Patient presents with   Arm Pain    Claire Stewart is a 22 y.o. female with past medical history ADHD, autism, epilepsy, cognitive disability who presents to the ED with mother for evaluation of left hand and arm pain.  Mom reports that patient has been dealing with persistent tachycardia for some time now and was evaluated by cardiology and started on metoprolol 25 mg.  She reports that patient had no further workup and they were trialing metoprolol for 6 months before proceeding with anything else.  Questionable if tachycardia onset following patient having COVID in January 2024.  Mom states that she is worried for patient's heart especially with the new left arm pain and the tachycardia.  No recent travel, no recent surgery, no hormone use.  No leg pain or swelling.  No history of DVT/PE.  Patient denies associated chest pain or shortness of breath today but has had these in the past before she was started on the metoprolol.  She denies associated nausea, vomiting, diaphoresis, abdominal pain, diarrhea, fever, chills, or syncope.  No family history of early heart disease.  Patient reports that she was resting when pain onset.  It has improved since onset and is currently rated a 2/10.  No injury to the hand or arm.  No pain in the neck, back, or shoulder.       Home Medications Prior to Admission medications   Medication Sig Start Date End Date Taking? Authorizing Provider  diltiazem (CARDIZEM) 30 MG tablet Take 1 tablet (30 mg total) by mouth every 8 (eight) hours as needed. 12/14/22   Mallipeddi, Vishnu P, MD  cetirizine (ZYRTEC) 10 MG tablet Take 1 tablet (10 mg total) by mouth daily. 05/26/22   Gwenlyn Fudge, FNP  cyclobenzaprine (FEXMID) 7.5 MG tablet Take 1 tablet (7.5 mg total) by mouth 3 (three) times daily as needed for  muscle spasms. 06/29/22   Marguerita Beards, MD  FIBER PO Take 1 tablet by mouth daily.    [provider]  levETIRAcetam (KEPPRA XR) 750 MG 24 hr tablet Take 4 tablets (3,000 mg total) by mouth daily. Take 4 tablets at night 10/12/22   Glean Salvo, NP  magnesium gluconate (MAGONATE) 500 MG tablet Take 500 mg by mouth daily.    [provider]  meloxicam (MOBIC) 7.5 MG tablet Take 1 tablet (7.5 mg total) by mouth daily as needed. for pain 10/12/22   Glean Salvo, NP  metoprolol tartrate (LOPRESSOR) 25 MG tablet Take 1 tablet (25 mg total) by mouth 2 (two) times daily. 11/17/22   Mallipeddi, Vishnu P, MD  naproxen (NAPROSYN) 500 MG tablet TAKE 1 TABLET BY MOUTH 2 TIMES DAILY WITH A MEAL. 12/08/22   Gabriel Earing, FNP  norethindrone-ethinyl estradiol-FE (JUNEL FE 1/20) 1-20 MG-MCG tablet Take 1 tablet by mouth daily. 11/24/22   Gabriel Earing, FNP  omeprazole (PRILOSEC) 20 MG capsule Take 1 capsule (20 mg total) by mouth daily. 05/26/22   Gwenlyn Fudge, FNP  OVER THE COUNTER MEDICATION Gummy multi vit    [provider]  OVER THE COUNTER MEDICATION Gummy digestive advantage    [provider]  PRESCRIPTION MEDICATION Dialstat    [provider]  prochlorperazine (COMPAZINE) 10 MG tablet Take 1 tablet (10 mg total) by mouth every 8 (  eight) hours as needed for nausea or vomiting. 10/12/22   Glean Salvo, NP  Rimegepant Sulfate (NURTEC) 75 MG TBDP Take 75 mg by mouth as needed (take 1 tablet at onset of headache). 04/07/22   Glean Salvo, NP  rizatriptan (MAXALT-MLT) 10 MG disintegrating tablet Take 1 tablet (10 mg total) by mouth as needed. May repeat in 2 hours if needed 10/12/22   Glean Salvo, NP  topiramate (TOPAMAX) 100 MG tablet Take 1 tablet (100 mg total) by mouth 2 (two) times daily. 04/07/22   Glean Salvo, NP  Vitamin D, Ergocalciferol, (DRISDOL) 1.25 MG (50000 UNIT) CAPS capsule TAKE 1 CAPSULE (50,000 UNITS TOTAL) BY MOUTH EVERY 7  (SEVEN) DAYS 11/07/22   Gabriel Earing, FNP      Allergies    Sulfa antibiotics and Levetiracetam    Review of Systems   Review of Systems  All other systems reviewed and are negative.   Physical Exam Updated Vital Signs BP (!) 131/91   Pulse 98   Temp 98.5 F (36.9 C) (Oral)   Resp 18   Ht 5' 4.5" (1.638 m)   Wt 99.8 kg   LMP 09/24/2022 Comment: continuous estrogen  SpO2 100%   BMI 37.18 kg/m  Physical Exam Vitals and nursing note reviewed.  Constitutional:      General: She is not in acute distress.    Appearance: Normal appearance. She is not ill-appearing, toxic-appearing or diaphoretic.  HENT:     Head: Normocephalic and atraumatic.     Mouth/Throat:     Mouth: Mucous membranes are moist.  Eyes:     Conjunctiva/sclera: Conjunctivae normal.     Pupils: Pupils are equal, round, and reactive to light.  Cardiovascular:     Rate and Rhythm: Regular rhythm. Tachycardia present.     Heart sounds: No murmur heard.    No gallop.  Pulmonary:     Effort: Pulmonary effort is normal. No respiratory distress.     Breath sounds: Normal breath sounds. No stridor. No wheezing, rhonchi or rales.  Abdominal:     General: Abdomen is flat. There is no distension.     Palpations: Abdomen is soft. There is no mass.     Tenderness: There is no abdominal tenderness. There is no guarding or rebound.  Musculoskeletal:        General: Normal range of motion.     Cervical back: Normal range of motion and neck supple. No rigidity.     Right lower leg: No edema.     Left lower leg: No edema.     Comments: No calf tenderness bilaterally  Skin:    General: Skin is warm and dry.     Capillary Refill: Capillary refill takes less than 2 seconds.  Neurological:     General: No focal deficit present.     Mental Status: She is alert. Mental status is at baseline.  Psychiatric:        Mood and Affect: Mood normal.        Behavior: Behavior normal.     ED Results / Procedures /  Treatments   Labs (all labs ordered are listed, but only abnormal results are displayed) Labs Reviewed  BASIC METABOLIC PANEL - Abnormal; Notable for the following components:      Result Value   Sodium 134 (*)    All other components within normal limits  CBC - Abnormal; Notable for the following components:   RDW 11.3 (*)  All other components within normal limits  D-DIMER, QUANTITATIVE  TROPONIN I (HIGH SENSITIVITY)    EKG EKG Interpretation  Date/Time:  Friday January 06 2023 15:37:32 EDT Ventricular Rate:  84 PR Interval:  138 QRS Duration: 90 QT Interval:  372 QTC Calculation: 439 R Axis:   57 Text Interpretation: Normal sinus rhythm Normal ECG No previous ECGs available Confirmed by Alvino Blood ([id number]) on 01/06/2023 3:40:12 PM  Radiology DG Chest 2 View  Result Date: 01/06/2023 CLINICAL DATA:  Left arm pain EXAM: CHEST - 2 VIEW COMPARISON:  CXR 10/21/22 FINDINGS: No pleural effusion. No pneumothorax. No focal airspace opacity. Normal cardiac and mediastinal contours. No radiographically apparent displaced rib fractures. Visualized upper abdomen is unremarkable. Vertebral body heights are maintained. IMPRESSION: No active cardiopulmonary disease. Electronically Signed   By: Lorenza Cambridge M.D.   On: 01/06/2023 15:36    Procedures Procedures    Medications Ordered in ED Medications - No data to display  ED Course/ Medical Decision Making/ A&P                             Medical Decision Making Amount and/or Complexity of Data Reviewed Labs: ordered. Decision-making details documented in ED Course. Radiology: ordered. Decision-making details documented in ED Course. ECG/medicine tests: ordered. Decision-making details documented in ED Course.   Medical Decision Making:   Narelle Schoening. Peri is a 22 y.o. female who presented to the ED today with L arm pain detailed above.    Additional history discussed with patient's family/caregivers.  Patient's presentation is  complicated by their history of multiple comorbidities, tachycardia.  Complete initial physical exam performed, notably the patient  was in no acute distress.  Neurologically intact.  No tenderness of the chest wall.  Equal strength bilaterally.  Mild tachycardia and lungs clear to auscultation.  No lower extremity edema.  No calf tenderness.  Abdomen soft and nontender.    Reviewed and confirmed nursing documentation for past medical history, family history, social history.    Initial Assessment:   With the patient's presentation of L arm pain, the differential diagnosis includes but is not limited to: Acute coronary syndrome, pericarditis, aortic dissection, pulmonary embolism, tension pneumothorax, esophageal rupture, angina, aortic stenosis, cardiomyopathy, myocarditis, mitral valve prolapse, pulmonary hypertension, hypertrophic obstructive cardiomyopathy (HOCM), aortic insufficiency, right ventricular hypertrophy, pneumonia, pleuritis, bronchitis, pneumothorax, tumor, gastroesophageal reflux disease (GERD), esophageal spasm, Mallory-Weiss syndrome, peptic ulcer disease, biliary disease, pancreatitis, functional gastrointestinal pain, cervical or thoracic disk disease or arthritis, shoulder arthritis, costochondritis, subacromial bursitis, anxiety or panic attack, herpes zoster, breast disorders, chest wall tumors, thoracic outlet syndrome, mediastinitis, arrhythmia, fracture, dislocation, radiculopathy.     Initial Plan:  Screening labs including CBC and Metabolic panel to evaluate for infectious or metabolic etiology of disease.  CXR to evaluate for structural/infectious intrathoracic pathology.  EKG and troponin to evaluate for cardiac pathology D-dimer to assess risk of DVT/PE Objective evaluation as reviewed   Initial Study Results:   Laboratory  All laboratory results reviewed without evidence of clinically relevant pathology.   Exceptions include: NA 134  EKG EKG was reviewed  independently. ST segments without concerns for elevations.   EKG: normal EKG, normal sinus rhythm.   Radiology:  All images reviewed independently. Agree with radiology report at this time.   DG Chest 2 View  Result Date: 01/06/2023 CLINICAL DATA:  Left arm pain EXAM: CHEST - 2 VIEW COMPARISON:  CXR 10/21/22 FINDINGS: No pleural effusion. No pneumothorax.  No focal airspace opacity. Normal cardiac and mediastinal contours. No radiographically apparent displaced rib fractures. Visualized upper abdomen is unremarkable. Vertebral body heights are maintained. IMPRESSION: No active cardiopulmonary disease. Electronically Signed   By: Lorenza Cambridge M.D.   On: 01/06/2023 15:36   ECHOCARDIOGRAM COMPLETE  Result Date: 12/09/2022    ECHOCARDIOGRAM REPORT   Patient Name:   Claire Stewart Date of Exam: 12/08/2022 Medical Rec #:  [id number]       Height:       64.5 in Accession #:    [id number]      Weight:       218.0 lb Date of Birth:  2000/10/24       BSA:          2.040 m Patient Age:    21 years        BP:           117/67 mmHg Patient Gender: F               HR:           93 bpm. Exam Location:  [place] Procedure: 2D Echo, 3D Echo, Cardiac Doppler, Color Doppler and Strain Analysis Indications:    R07.9* Chest pain, unspecified  History:        Patient has no prior history of Echocardiogram examinations. Hx                 ASD, Seizure disorder, Covid, Morbidly obese,                 Arrythmias:Tachycardia, Signs/Symptoms:Chest Pain and                 Dizziness/Lightheadedness; Risk Factors:Non-Smoker, Hypertension                 and Dyslipidemia.  Sonographer:    Jake Seats RDMS, RVT, RDCS Referring Phys: [id number] VISHNU P MALLIPEDDI IMPRESSIONS  1. Left ventricular ejection fraction, by estimation, is 60 to 65%. The left ventricle has normal function. The left ventricle has no regional wall motion abnormalities. Left ventricular diastolic parameters were normal. The average left ventricular global longitudinal  strain is -22.9 %. The global longitudinal strain is normal.  2. Right ventricular systolic function is normal. The right ventricular size is normal. Tricuspid regurgitation signal is inadequate for assessing PA pressure.  3. The mitral valve is normal in structure. No evidence of mitral valve regurgitation. No evidence of mitral stenosis.  4. The aortic valve was not well visualized. Aortic valve regurgitation is not visualized. No aortic stenosis is present.  5. The inferior vena cava is normal in size with greater than 50% respiratory variability, suggesting right atrial pressure of 3 mmHg. Comparison(s): No prior Echocardiogram. Conclusion(s)/Recommendation(s): Normal biventricular function without evidence of hemodynamically significant valvular heart disease. FINDINGS  Left Ventricle: Left ventricular ejection fraction, by estimation, is 60 to 65%. The left ventricle has normal function. The left ventricle has no regional wall motion abnormalities. The average left ventricular global longitudinal strain is -22.9 %. The global longitudinal strain is normal. The left ventricular internal cavity size was normal in size. There is no left ventricular hypertrophy. Left ventricular diastolic parameters were normal. Right Ventricle: The right ventricular size is normal. No increase in right ventricular wall thickness. Right ventricular systolic function is normal. Tricuspid regurgitation signal is inadequate for assessing PA pressure. Left Atrium: Left atrial size was normal in size. Right Atrium: Right atrial size was normal in size. Pericardium: There is no  evidence of pericardial effusion. Mitral Valve: The mitral valve is normal in structure. No evidence of mitral valve regurgitation. No evidence of mitral valve stenosis. Tricuspid Valve: The tricuspid valve is normal in structure. Tricuspid valve regurgitation is not demonstrated. No evidence of tricuspid stenosis. Aortic Valve: The aortic valve was not well  visualized. Aortic valve regurgitation is not visualized. No aortic stenosis is present. Aortic valve mean gradient measures 3.0 mmHg. Aortic valve peak gradient measures 6.2 mmHg. Aortic valve area, by VTI measures 2.72 cm. Pulmonic Valve: The pulmonic valve was normal in structure. Pulmonic valve regurgitation is not visualized. No evidence of pulmonic stenosis. Aorta: The aortic root is normal in size and structure. Venous: The inferior vena cava is normal in size with greater than 50% respiratory variability, suggesting right atrial pressure of 3 mmHg. IAS/Shunts: The interatrial septum was not well visualized.  LEFT VENTRICLE PLAX 2D LVIDd:         4.50 cm     Diastology LVIDs:         2.80 cm     LV e' medial:    12.40 cm/s LV PW:         0.80 cm     LV E/e' medial:  7.2 LV IVS:        0.60 cm     LV e' lateral:   20.80 cm/s LVOT diam:     1.80 cm     LV E/e' lateral: 4.3 LV SV:         56 LV SV Index:   28          2D Longitudinal Strain LVOT Area:     2.54 cm    2D Strain GLS (A2C):   -24.1 %                            2D Strain GLS (A3C):   -24.6 %                            2D Strain GLS (A4C):   -19.9 % LV Volumes (MOD)           2D Strain GLS Avg:     -22.9 % LV vol d, MOD A2C: 76.7 ml LV vol d, MOD A4C: 65.6 ml LV vol s, MOD A2C: 27.4 ml LV vol s, MOD A4C: 23.5 ml 3D Volume EF: LV SV MOD A2C:     49.3 ml 3D EF:        63 % LV SV MOD A4C:     65.6 ml LV EDV:       190 ml LV SV MOD BP:      48.1 ml LV ESV:       71 ml                            LV SV:        120 ml RIGHT VENTRICLE RV S prime:     10.80 cm/s TAPSE (M-mode): 2.1 cm LEFT ATRIUM             Index        RIGHT ATRIUM          Index LA diam:        2.70 cm 1.32 cm/m   RA Area:     9.82 cm LA Vol (A2C):  48.9 ml 23.97 ml/m  RA Volume:   22.60 ml 11.08 ml/m LA Vol (A4C):   40.8 ml 20.00 ml/m LA Biplane Vol: 45.9 ml 22.50 ml/m  AORTIC VALVE AV Area (Vmax):    2.20 cm AV Area (Vmean):   2.57 cm AV Area (VTI):     2.72 cm AV Vmax:            125.00 cm/s AV Vmean:          81.300 cm/s AV VTI:            0.207 m AV Peak Grad:      6.2 mmHg AV Mean Grad:      3.0 mmHg LVOT Vmax:         108.00 cm/s LVOT Vmean:        82.200 cm/s LVOT VTI:          0.221 m LVOT/AV VTI ratio: 1.07  AORTA Ao Root diam: 2.70 cm MITRAL VALVE MV Area (PHT): 4.49 cm    SHUNTS MV Decel Time: 169 msec    Systemic VTI:  0.22 m MV E velocity: 89.50 cm/s  Systemic Diam: 1.80 cm MV A velocity: 74.10 cm/s MV E/A ratio:  1.21 Vishnu Priya Mallipeddi Electronically signed by Winfield Rast Mallipeddi Signature Date/Time: 12/09/2022/11:57:09 AM    Final     Final Assessment and Plan:   22 year old female with multiple medical comorbidities presents to the ED with mother for evaluation of left arm pain.  No injury or fall.  Patient/mom more concerned for atypical chest pain.  Patient with persistent tachycardia since being diagnosed with COVID-19 in January 2024.  Placed on metoprolol by cardiology.  Has an appointment for a second opinion in June 2024.  With tachycardia, patient was having intermittent chest pain and shortness of breath with near syncope as well but for the most part this has resolved and patient has not had any of this today since the onset of left arm pain.  Workup initiated as above for further evaluation.  Overall, patient nontoxic-appearing and in no acute distress.  Minimal tachycardia with regular rhythm.  Lungs clear to auscultation.  No lower extremity edema.  No calf tenderness.  No deformity, overlying skin changes, or other abnormalities noted to the left hand or upper extremity.  No chest wall tenderness.  EKG normal sinus rhythm without acute ST-T changes.  Normal intervals.  Workup obtained as above for further assessment of patient's symptoms.  Patient/mother confirmed multiple times that they are here to rule out cardiac cause of patient's symptoms do not suspect significant pathology related to the left hand or arm.  Troponin negative.  EKG normal.   No significant electrolyte disturbance or alteration in kidney function.  Chest x-ray negative.  D-dimer negative.  No risk factors identified for DVT/PE.  Overall, very reassuring workup which was discussed in detail with patient/mother.  Prior to knowing that patient has a second opinion appointment scheduled with cardiology, I did place a referral to cardiology with the on-call provider today and patient/mother aware of this and will attempt to obtain earlier appointment if available.  Discussed with patient/mother strict ED return precautions, red flag symptoms related to ACS and other emergent conditions.  Patient/mother expressed understanding of this and will return as needed.  All questions answered and patient stable for discharge.   Clinical Impression:  1. Left arm pain   2. Atypical chest pain   3. Tachycardia      Discharge  Final Clinical Impression(s) / ED Diagnoses Final diagnoses:  Left arm pain  Atypical chest pain  Tachycardia    Rx / DC Orders ED Discharge Orders          Ordered    Ambulatory referral to Cardiology       Comments: If you have not heard from the Cardiology office within the next 72 hours please call [phone].   01/06/23 1721              Tonette Lederer, PA-C 01/06/23 1746    Lonell Grandchild, MD 01/07/23 1511

## 2023-01-06 NOTE — ED Notes (Signed)
Pt resting quielty reading book

## 2023-01-26 ENCOUNTER — Other Ambulatory Visit (HOSPITAL_COMMUNITY): Payer: Self-pay

## 2023-01-28 ENCOUNTER — Other Ambulatory Visit (HOSPITAL_COMMUNITY): Payer: Self-pay

## 2023-01-30 ENCOUNTER — Other Ambulatory Visit (HOSPITAL_COMMUNITY): Payer: Self-pay

## 2023-02-05 ENCOUNTER — Telehealth: Payer: Self-pay

## 2023-02-05 ENCOUNTER — Other Ambulatory Visit (HOSPITAL_COMMUNITY): Payer: Self-pay

## 2023-02-05 NOTE — Telephone Encounter (Signed)
I have received multiple PA requests via CMM for this PT-I saw the previous PA phone note where the PT states insurance is the same and that you will continue to process thru medicaid however PT has two insurances and medicaid is secondary when I try to run test claims-so [employer] (Primary) is who is trying to get a PA for this medication. However when I run it it appears as though maybe the phone number and or zip code we have in epic is different than what [employer] has-if they wish to have the PA processed I will need to know what the phone number and or zip code they have with [employer]-if what we have is correct they will need to call [employer] and correct the issue with them.        Same message when I take the name out they sent with the PA.

## 2023-02-07 ENCOUNTER — Other Ambulatory Visit (HOSPITAL_COMMUNITY): Payer: Self-pay

## 2023-02-09 NOTE — Telephone Encounter (Signed)
I left the patient a voicemail reiterating the previous message.

## 2023-02-15 ENCOUNTER — Other Ambulatory Visit (HOSPITAL_COMMUNITY): Payer: Self-pay

## 2023-02-15 NOTE — Telephone Encounter (Signed)
Pharmacy Patient Advocate Encounter   Received notification from [employer] that prior authorization for Nurtec 75mg  ODT is required/requested.   PA submitted to [employer] via CoverMyMeds Key or (Medicaid) confirmation # [id number] Status is pending

## 2023-02-15 NOTE — Telephone Encounter (Signed)
Pharmacy Patient Advocate Encounter  Prior Authorization for Nurtec 75MG  ODT has been APPROVED by [employer] from 02/15/2023  to 02/15/2024.   PA # PA Case ID: [id number]  Copay is $0 per [facility] test claim.

## 2023-03-15 ENCOUNTER — Other Ambulatory Visit: Payer: Self-pay | Admitting: Internal Medicine

## 2023-03-15 ENCOUNTER — Other Ambulatory Visit: Payer: Self-pay | Admitting: Family Medicine

## 2023-03-15 DIAGNOSIS — E559 Vitamin D deficiency, unspecified: Secondary | ICD-10-CM

## 2023-03-28 DIAGNOSIS — R0781 Pleurodynia: Secondary | ICD-10-CM | POA: Diagnosis not present

## 2023-03-28 DIAGNOSIS — Q245 Malformation of coronary vessels: Secondary | ICD-10-CM | POA: Diagnosis not present

## 2023-03-28 DIAGNOSIS — I4711 Inappropriate sinus tachycardia, so stated: Secondary | ICD-10-CM | POA: Diagnosis not present

## 2023-04-20 ENCOUNTER — Ambulatory Visit: Payer: Managed Care, Other (non HMO) | Admitting: Neurology

## 2023-04-20 NOTE — Progress Notes (Deleted)
Patient: Claire Stewart. [place] Date of Birth: 2001-09-02  Reason for Visit: Follow up History from: Patient, mother Primary Neurologist: Dr. Terrace Arabia   ASSESSMENT AND PLAN 22 y.o. 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   HISTORY  Claire Stewart is a 22 year old female, seen in request by her primary care nurse practitioner [name] evaluation of seizure, learning disability, migraine headache, she is accompanied by her mother at today's clinical visit on February 27, 2020   I reviewed and summarized the referring note. She moved from [place] to [place] recently, was previously under the care of neurologist for seizure, migraine headaches   She was born full-term, but suffered jaundice, at 6 days old, she suffered prolonged seizure, later noted developmental delay, was found to have significant visual impairment, she has been homeschooled,   She was treated with antiepileptic medication as an infant following her initial prolonged seizure, but medicine was tapered off,   By 22 years old, she was noted to have frequent staring off spells, eventually was diagnosed with partial seizure, has been treated with Keppra titrating dose, currently taking extended release 750 mg 4 tablets at nighttime, works well for her, last seizure was on May 12, 2015, she had a sudden onset of staring  spells,   She has a long history of migraine headaches, now having 1-3 times each month, taking Topamax 50/100 mg as preventive medication, Imitrex works most of the time, but sometimes her headache is so severe, with significant light noise sensitivity, Imitrex would not work for those prolonged severe migraine headaches   She is legally blind,can read large print only,   UPDATE Aug 31 2020: She is accompanied by her mother at today's clinical visit, She had no recurrent seizure, tolerating Keppra xr 750 mg 4 tablets every night   She continue has frequent migraine headaches, often preceded by blurry vision, about once a week, as to be absent from her home schooling, Maxalt dissolvable only provide limited help, felt Imitrex nasal spray works better for her in the past   We personally reviewed MRI of the brain with and without contrast in July 2021, bilateral occipital, parietal occipital cystic encephalomalacia, gliosis, no acute abnormality   EEG was normal in July 2021   UPDATE April 07 2021: She still does home schooling, overall doing very well, like Keppra XR 500 mg 4 tablets every night, had no recurrent seizure for few years, she is dealing with endometriosis, has frequent lower abdominal pain, she also has occasionally migraine, couple times each month, Imitrex works well, but she preferred to go back Maxalt dissolvable over Imitrex subcutaneous injection, felt the benefit has not significant difference  Update April 07, 2022 SS: Here with her mom, remains on Keppra XR 750 mg, 4 tablets daily, Topamax 100 mg twice a day.  Takes Maxalt for acute headache.  Migraines improved after daith piercing. Has about 1 migraine a month, can last 1-4 days. Takes Maxalt,  Compazine, Meloxicam. Just finished high school.  On new birth control for endometriosis, break through bleeding, no bleeding for 3 months, wonder about weaning off Topamax?  Update October 12, 2022 SS: Via VV, 1 migraine a month, in  Dec/Jan 2 migraines a month due to weather change. Tried Nurtec, it helped, also has Maxalt, utilizes both for different types of headaches. Continues with breakthrough bleeding around her menstrual cycle. Has tried 3 different birth control. Remains on Keppra XR 750 XR 4 tablets daily. Topamax has been used to migraine prevention.   Update April 20, 2023 SS:    REVIEW OF SYSTEMS: Out of a complete 14 system review of symptoms, the patient complains only of the following symptoms, and all other reviewed systems are negative.  See HPI  ALLERGIES: Allergies  Allergen Reactions   Sulfa Antibiotics Hives   Levetiracetam Other (See Comments)    Can not have brand name- [employer]- caused nerve pain     HOME MEDICATIONS: Outpatient Medications Prior to Visit  Medication Sig Dispense Refill   diltiazem (CARDIZEM) 30 MG tablet Take 1 tablet (30 mg total) by mouth every 8 (eight) hours as needed. 30 tablet 0   cetirizine (ZYRTEC) 10 MG tablet Take 1 tablet (10 mg total) by mouth daily. 90 tablet 3   cyclobenzaprine (FEXMID) 7.5 MG tablet Take 1 tablet (7.5 mg total) by mouth 3 (three) times daily as needed for muscle spasms. 90 tablet 11   FIBER PO Take 1 tablet by mouth daily.     levETIRAcetam (KEPPRA XR) 750 MG 24 hr tablet Take 4 tablets (3,000 mg total) by mouth daily. Take 4 tablets at night 360 tablet 4   magnesium gluconate (MAGONATE) 500 MG tablet Take 500 mg by mouth daily.     meloxicam (MOBIC) 7.5 MG tablet Take 1 tablet (7.5 mg total) by mouth daily as needed. for pain 20 tablet 2   metoprolol tartrate (LOPRESSOR) 25 MG tablet TAKE 1 TABLET BY MOUTH TWICE A DAY 180 tablet 1   naproxen (NAPROSYN) 500 MG tablet TAKE 1 TABLET BY MOUTH 2 TIMES DAILY WITH A MEAL. 60 tablet 2   norethindrone-ethinyl estradiol-FE (JUNEL FE 1/20) 1-20 MG-MCG tablet Take 1 tablet by mouth daily. 84 tablet 3   omeprazole (PRILOSEC) 20 MG capsule Take 1 capsule (20 mg total) by mouth daily. 90 capsule 3    OVER THE COUNTER MEDICATION Gummy multi vit     OVER THE COUNTER MEDICATION Gummy digestive advantage     PRESCRIPTION MEDICATION Dialstat     prochlorperazine (COMPAZINE) 10 MG tablet Take 1 tablet (10 mg total) by mouth every 8 (eight) hours as needed for nausea or vomiting. 10 tablet 3   Rimegepant Sulfate (NURTEC) 75 MG TBDP Take 75 mg by mouth as needed (take 1 tablet at onset of headache). 8 tablet 11   rizatriptan (MAXALT-MLT) 10 MG disintegrating tablet Take 1 tablet (10 mg total) by mouth as needed. May repeat in 2 hours if needed 12 tablet 11   topiramate (TOPAMAX) 100 MG tablet Take 1 tablet (100 mg total) by mouth 2 (two) times daily. 180 tablet 4   Vitamin D, Ergocalciferol, (DRISDOL) 1.25 MG (50000 UNIT) CAPS capsule TAKE 1 CAPSULE (50,000 UNITS TOTAL) BY MOUTH EVERY 7 (SEVEN) DAYS 12 capsule 0   No facility-administered medications prior to visit.    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"   Cortical visual impairment    Depression    Elevated TSH 05/26/2021   Epilepsy (HCC)    Learning disabilities    global language learning disability   Legally blind    Migraines    Mixed hyperlipidemia 05/26/2021   Sensory processing difficulty    Short-term memory loss    Vitamin D deficiency 05/27/2022    PAST SURGICAL HISTORY: Past Surgical History:  Procedure Laterality Date   MOUTH SURGERY     WISDOM TOOTH EXTRACTION      FAMILY HISTORY: Family History  Problem Relation Age of Onset   Hyperlipidemia Mother    Allergies Mother    ADD / ADHD Mother    Depression Mother    Arthritis Mother    Alcohol abuse Father    Drug abuse Father    Depression Father    Thyroid disease Father    Allergies Brother     ADD / ADHD Brother    Cancer Paternal Aunt    Drug abuse Maternal Grandmother    Diabetes Maternal Grandmother    Bipolar disorder Maternal Grandmother    Seizures Maternal Grandmother    Migraines Maternal Grandmother    Hypertension Maternal Grandfather    Hyperlipidemia Maternal Grandfather    Depression Maternal Grandfather    Arthritis Maternal Grandfather    Breast cancer Cousin     SOCIAL HISTORY: Social History   Socioeconomic History   Marital status: Single    Spouse name: Not on file   Number of children: 0   Years of education: 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   Right-handed.   No daily use of caffeine.   Social Determinants of Health   Financial Resource Strain: Not on file  Food Insecurity: Not on file  Transportation Needs: Not on file  Physical Activity: Not on file  Stress: Not on file  Social Connections: Not on file  Intimate Partner Violence: Not on file   PHYSICAL EXAM  There were no vitals filed for this visit.  There is no height or weight on file to calculate BMI.  Generalized: Well developed, in no acute distress  Neurological examination  Mentation: Alert oriented to time, place, history provided mostly by her mother, cognitive impairment Cranial nerve II-XII: Pupils were equal round reactive to light. Extraocular movements were full, decreased peripheral vision, difficulty with finger count, facial sensation and strength were normal. Head turning and shoulder shrug  were normal and symmetric. Motor: The motor testing reveals 5 over 5  strength of all 4 extremities. Good symmetric motor tone is noted throughout.  Sensory: Sensory testing is intact to soft touch on all 4 extremities. No evidence of extinction is noted.  Coordination: Cerebellar testing reveals good finger-nose-finger and heel-to-shin bilaterally.  Gait and station: Gait is wide-based, cautious, has a walking stick  Reflexes: Deep tendon reflexes are symmetric and normal bilaterally.   DIAGNOSTIC DATA (LABS, IMAGING, TESTING) - I reviewed patient records, labs, notes, testing and imaging myself where available.  Lab Results  Component Value Date   WBC 7.9 01/06/2023   HGB 14.4 01/06/2023   HCT 44.4 01/06/2023   MCV 94.5 01/06/2023   PLT 296 01/06/2023      Component Value Date/Time   NA 134 (L) 01/06/2023 1548   NA 140 05/26/2022 1246   K 3.6 01/06/2023 1548   CL 100 01/06/2023 1548   CO2 24 01/06/2023 1548   GLUCOSE 81 01/06/2023 1548   BUN 16 01/06/2023 1548   BUN 13 05/26/2022 1246   CREATININE 0.88 01/06/2023 1548   CALCIUM 9.2 01/06/2023 1548   PROT 7.7 05/26/2022 1246   ALBUMIN 4.5 05/26/2022 1246   AST 14 05/26/2022 1246   ALT 13 05/26/2022 1246   ALKPHOS 107 05/26/2022 1246   BILITOT 0.4 05/26/2022 1246   GFRNONAA >60 01/06/2023 1548   GFRAA 120 02/27/2020 0941   Lab Results  Component Value Date   CHOL 209 (H) 05/26/2022   HDL 44 05/26/2022   LDLCALC 115 (H) 05/26/2022   TRIG 288 (H) 05/26/2022   CHOLHDL 4.8 (H) 05/26/2022   No results found for: "HGBA1C" No results found for: "VITAMINB12" Lab Results  Component Value Date   TSH 4.660 (H) 11/24/2022    Margie Ege, AGNP-C, DNP 04/20/2023, 6:00 AM [facility] [address] [place], [place] [address] [phone]

## 2023-04-26 ENCOUNTER — Encounter: Payer: Self-pay | Admitting: Obstetrics and Gynecology

## 2023-04-26 ENCOUNTER — Ambulatory Visit (INDEPENDENT_AMBULATORY_CARE_PROVIDER_SITE_OTHER): Payer: Managed Care, Other (non HMO) | Admitting: Obstetrics and Gynecology

## 2023-04-26 ENCOUNTER — Ambulatory Visit: Payer: Managed Care, Other (non HMO) | Admitting: Obstetrics and Gynecology

## 2023-04-26 DIAGNOSIS — M62838 Other muscle spasm: Secondary | ICD-10-CM | POA: Diagnosis not present

## 2023-04-26 MED ORDER — CYCLOBENZAPRINE HCL 7.5 MG PO TABS
7.5000 mg | ORAL_TABLET | Freq: Three times a day (TID) | ORAL | 11 refills | Status: DC | PRN
Start: 2023-04-26 — End: 2023-05-29

## 2023-04-26 NOTE — Progress Notes (Signed)
[facility] Return Visit  SUBJECTIVE  History of Present Illness: Claire Stewart is a 22 y.o. female seen in follow-up for pelvic pain/ muscle spasm.   Taking cyclobenzaprine 7.5mg  twice a day. Needs to take it a 3rd time if she is more active. Not causing any sedation. Denies burning with urination. She is having more pain on the side of her rib cage, and feels knots along there.   Back to the [place] for birth control and stopped topiramate.   Had covid in January, and after started having some palpitations. She is now taking metoprolol and diltiazem prn.   Past Medical History: Patient  has a past medical history of ADHD, Atypical squamous cell changes of undetermined significance (ASCUS) on vaginal cytology (06/06/2022), Autism, Brain damage, Cortical visual impairment, Depression, Elevated TSH (05/26/2021), Epilepsy (HCC), Learning disabilities, Legally blind, Migraines, Mixed hyperlipidemia (05/26/2021), Sensory processing difficulty, Short-term memory loss, and Vitamin D deficiency (05/27/2022).   Past Surgical History: She  has a past surgical history that includes Wisdom tooth extraction and Mouth surgery.   Medications: She has a current medication list which includes the following prescription(s): cetirizine, diltiazem, fiber, levetiracetam, magnesium gluconate, meloxicam, metoprolol tartrate, naproxen, norethindrone-ethinyl estradiol-fe, OVER THE COUNTER MEDICATION, OVER THE COUNTER MEDICATION, prochlorperazine, nurtec, rizatriptan, vitamin d (ergocalciferol), cyclobenzaprine, PRESCRIPTION MEDICATION, and topiramate.   Allergies: Patient is allergic to sulfa antibiotics and levetiracetam.   Social History: Patient  reports that she has never smoked. She has never used smokeless tobacco. She reports that she does not drink alcohol and does not use drugs.      OBJECTIVE     Physical Exam: Vitals:   04/26/23 1440  BP: 128/84  Pulse: (!) 102    Gen: No  apparent distress, A&O x 3. Tenderness along superior ribcage under axilla.    ASSESSMENT AND PLAN    Ms. Claire Stewart is a 22 y.o. with:  1. Muscle spasm    - Will continue with flexeril 7.5mg  TID prn, refill provided.  - Patient and mother state that her PCP may be comfortable with prescribing in the future.   Follow up as needed  Claire Beards, MD  Time spent: I spent 20 minutes dedicated to the care of this patient on the date of this encounter to include pre-visit review of records, face-to-face time with the patient and post visit documentation and ordering medication/ testing.

## 2023-05-03 ENCOUNTER — Other Ambulatory Visit: Payer: Self-pay | Admitting: Family Medicine

## 2023-05-03 ENCOUNTER — Other Ambulatory Visit: Payer: Self-pay | Admitting: Neurology

## 2023-05-03 DIAGNOSIS — E559 Vitamin D deficiency, unspecified: Secondary | ICD-10-CM

## 2023-05-03 DIAGNOSIS — R0782 Intercostal pain: Secondary | ICD-10-CM

## 2023-05-17 ENCOUNTER — Telehealth: Payer: Self-pay | Admitting: Neurology

## 2023-05-17 NOTE — Telephone Encounter (Signed)
LVM and sent mychart msg informing pt of need to reschedule 07/18/23 appt - office closing for election day

## 2023-05-18 ENCOUNTER — Ambulatory Visit: Payer: Managed Care, Other (non HMO) | Admitting: Internal Medicine

## 2023-05-29 ENCOUNTER — Ambulatory Visit: Payer: Managed Care, Other (non HMO) | Admitting: Family Medicine

## 2023-05-29 VITALS — BP 113/70 | HR 96 | Temp 98.3°F | Ht 64.5 in | Wt 234.4 lb

## 2023-05-29 DIAGNOSIS — Z0000 Encounter for general adult medical examination without abnormal findings: Secondary | ICD-10-CM

## 2023-05-29 DIAGNOSIS — E038 Other specified hypothyroidism: Secondary | ICD-10-CM

## 2023-05-29 DIAGNOSIS — J301 Allergic rhinitis due to pollen: Secondary | ICD-10-CM

## 2023-05-29 DIAGNOSIS — M62838 Other muscle spasm: Secondary | ICD-10-CM

## 2023-05-29 DIAGNOSIS — E559 Vitamin D deficiency, unspecified: Secondary | ICD-10-CM

## 2023-05-29 DIAGNOSIS — K219 Gastro-esophageal reflux disease without esophagitis: Secondary | ICD-10-CM | POA: Diagnosis not present

## 2023-05-29 DIAGNOSIS — F322 Major depressive disorder, single episode, severe without psychotic features: Secondary | ICD-10-CM

## 2023-05-29 DIAGNOSIS — E782 Mixed hyperlipidemia: Secondary | ICD-10-CM | POA: Diagnosis not present

## 2023-05-29 DIAGNOSIS — Z0001 Encounter for general adult medical examination with abnormal findings: Secondary | ICD-10-CM

## 2023-05-29 DIAGNOSIS — F419 Anxiety disorder, unspecified: Secondary | ICD-10-CM

## 2023-05-29 DIAGNOSIS — M546 Pain in thoracic spine: Secondary | ICD-10-CM

## 2023-05-29 MED ORDER — LEVOCETIRIZINE DIHYDROCHLORIDE 5 MG PO TABS
5.0000 mg | ORAL_TABLET | Freq: Every evening | ORAL | 3 refills | Status: DC
Start: 2023-05-29 — End: 2024-03-01

## 2023-05-29 MED ORDER — CYCLOBENZAPRINE HCL 7.5 MG PO TABS
7.5000 mg | ORAL_TABLET | Freq: Three times a day (TID) | ORAL | 11 refills | Status: DC | PRN
Start: 2023-05-29 — End: 2024-06-26

## 2023-05-29 NOTE — Patient Instructions (Signed)

## 2023-05-29 NOTE — Progress Notes (Unsigned)
BY MOUTH EVERY DAY  . cyclobenzaprine (FEXMID) 7.5 MG tablet Take 1 tablet (7.5 mg total) by mouth 3 (three) times daily as needed for muscle spasms.  Marland Kitchen diltiazem (CARDIZEM) 30 MG tablet Take 1 tablet (30 mg total) by mouth every 8 (eight) hours as needed.  Marland Kitchen FIBER PO Take 1 tablet by mouth daily.  Marland Kitchen levETIRAcetam (KEPPRA XR) 750 MG 24 hr tablet Take 4 tablets (3,000 mg total) by mouth daily. Take 4 tablets at night  . magnesium gluconate (MAGONATE) 500 MG tablet Take 500 mg by mouth daily.  . meloxicam (MOBIC) 7.5 MG tablet Take 1 tablet (7.5 mg total) by mouth daily as needed. for pain  . metoprolol tartrate (LOPRESSOR) 25 MG tablet TAKE 1 TABLET BY MOUTH TWICE A DAY  . naproxen (NAPROSYN) 500 MG tablet TAKE 1 TABLET BY MOUTH TWICE A DAY WITH FOOD  . norethindrone-ethinyl estradiol-FE (JUNEL FE 1/20) 1-20 MG-MCG tablet Take 1 tablet by mouth daily.  . NURTEC 75 MG TBDP TAKE 75 MG BY MOUTH AS NEEDED (TAKE 1 TABLET AT  ONSET OF HEADACHE).  Marland Kitchen OVER THE COUNTER MEDICATION Gummy multi vit  . OVER THE COUNTER MEDICATION Gummy digestive advantage  . PRESCRIPTION MEDICATION Dialstat  . prochlorperazine (COMPAZINE) 10 MG tablet Take 1 tablet (10 mg total) by mouth every 8 (eight) hours as needed for nausea or vomiting.  . rizatriptan (MAXALT-MLT) 10 MG disintegrating tablet Take 1 tablet (10 mg total) by mouth as needed. May repeat in 2 hours if needed  . topiramate (TOPAMAX) 100 MG tablet Take 1 tablet (100 mg total) by mouth 2 (two) times daily.  . [DISCONTINUED] Vitamin D, Ergocalciferol, (DRISDOL) 1.25 MG (50000 UNIT) CAPS capsule TAKE 1 CAPSULE (50,000 UNITS TOTAL) BY MOUTH EVERY 7 (SEVEN) DAYS   No facility-administered medications prior to visit.    ROS        Objective:     BP 113/70   Pulse 96   Temp 98.3 F (36.8 C) (Temporal)   Ht 5' 4.5" (1.638 m)   Wt 234 lb 6 oz (106.3 kg)   SpO2 96%   BMI 39.61 kg/m  {Vitals History (Optional):23777}  Physical Exam   No results found for any visits on 05/29/23. {Show previous labs (optional):23779}    Assessment & Plan:    Routine Health Maintenance and Physical Exam  Immunization History  Administered Date(s) Administered  . DTaP 05/25/2000, 07/24/2000, 10/04/2000, 07/25/2001, 03/26/2004  . HIB (PRP-OMP) 05/25/2000, 07/24/2000, 10/04/2000, 04/09/2001  . Hepatitis A 03/29/2005, 01/14/2006  . Hepatitis B 2000/02/23, 05/25/2000, 10/04/2000  . IPV 05/25/2000, 07/24/2000, 10/04/2000, 03/26/2004  . MMR 07/25/2001, 03/26/2004  . Meningococcal Conjugate 04/06/2011, 04/15/2016  . Tdap 04/06/2011, 05/25/2021  . Varicella 04/09/2001, 12/06/2006    Health Maintenance  Topic Date Due  . INFLUENZA VACCINE  Never done  . Cervical Cancer Screening (Pap smear)  05/28/2024 (Originally 05/27/2023)  . HPV VACCINES (1 - 3-dose series) 05/28/2024 (Originally 03/25/2015)  . Hepatitis C Screening  05/28/2024 (Originally 03/24/2018)  . HIV Screening   05/28/2024 (Originally 03/25/2015)  . COVID-19 Vaccine (1 - 2023-24 season) 06/13/2024 (Originally 05/14/2023)  . DTaP/Tdap/Td (8 - Td or Tdap) 05/26/2031    Discussed health benefits of physical activity, and encouraged her to engage in regular exercise appropriate for her age and condition.  Problem List Items Addressed This Visit       Digestive   Gastroesophageal reflux disease     Endocrine   Subclinical hypothyroidism     Other  Complete physical exam  Patient: Claire Stewart. [place]   DOB: 06/08/2001   22 y.o. Female  MRN: [id number]  Subjective:    Chief Complaint  Patient presents with  . Annual Exam    Claire Stewart is a 22 y.o. female who presents today for a complete physical exam. She reports consuming a  regular  diet. The patient does not participate in regular exercise at present. She generally feels fairly well. She reports sleeping fairly well. She does have additional problems to discuss today.   Family has been working on [employer].   She has been feeling some symptoms of anxiety and depression lately. Denies recent triggering event. She is interested in counseling but would like to see a female [place] based counselor. Her mother has one in mind that she plans to ask her regarding insurance coverage.   Most recent fall risk assessment:    11/24/2022    4:01 PM  Fall Risk   Falls in the past year? 0     Most recent depression screenings:    05/29/2023    3:13 PM 11/24/2022    4:01 PM 10/28/2022   12:16 PM  Depression screen PHQ 2/9  Decreased Interest 2 0 0  Down, Depressed, Hopeless 3 0 0  PHQ - 2 Score 5 0 0  Altered sleeping 2 1 3   Tired, decreased energy 2 1 2   Change in appetite 3 0 0  Feeling bad or failure about yourself  3 0 0  Trouble concentrating 3 2 2   Moving slowly or fidgety/restless 0 0 0  Suicidal thoughts 0 0 0  PHQ-9 Score 18 4 7   Difficult doing work/chores Very difficult Somewhat difficult Very difficult      05/29/2023    3:12 PM 11/24/2022    4:01 PM 10/28/2022   12:16 PM 10/21/2022    9:28 AM  GAD 7 : Generalized Anxiety Score  Nervous, Anxious, on Edge 2 0 0 1  Control/stop worrying 1 0 2 2  Worry too much - different things 2 0 2 2  Trouble relaxing 2 0 1 0  Restless 3 0 0 0  Easily annoyed or irritable 3 0 0 1  Afraid - awful might happen 3 0 0 1  Total GAD 7 Score 16 0 5 7  Anxiety Difficulty Very difficult Not difficult at all Somewhat  difficult Very difficult      Vision:Within last year and Dental: No current dental problems and No regular dental care   Past Medical History:  Diagnosis Date  . ADHD   . Atypical squamous cell changes of undetermined significance (ASCUS) on vaginal cytology 06/06/2022   Recommendation - repeat pap smear in one year  . Autism    mildly  . 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 Cortical visual impairment   . Depression   . Elevated TSH 05/26/2021  . Epilepsy (HCC)   . Learning disabilities    global language learning disability  . Legally blind   . Migraines   . Mixed hyperlipidemia 05/26/2021  . Sensory processing difficulty   . Short-term memory loss   . Vitamin D deficiency 05/27/2022      Patient Care Team: Gabriel Earing, FNP as PCP - General (Family Medicine) Mallipeddi, Orion Modest, MD as PCP - Cardiology (Cardiology)   Outpatient Medications Prior to Visit  Medication Sig  . cetirizine (ZYRTEC) 10 MG tablet TAKE 1 TABLET  BY MOUTH EVERY DAY  . cyclobenzaprine (FEXMID) 7.5 MG tablet Take 1 tablet (7.5 mg total) by mouth 3 (three) times daily as needed for muscle spasms.  Marland Kitchen diltiazem (CARDIZEM) 30 MG tablet Take 1 tablet (30 mg total) by mouth every 8 (eight) hours as needed.  Marland Kitchen FIBER PO Take 1 tablet by mouth daily.  Marland Kitchen levETIRAcetam (KEPPRA XR) 750 MG 24 hr tablet Take 4 tablets (3,000 mg total) by mouth daily. Take 4 tablets at night  . magnesium gluconate (MAGONATE) 500 MG tablet Take 500 mg by mouth daily.  . meloxicam (MOBIC) 7.5 MG tablet Take 1 tablet (7.5 mg total) by mouth daily as needed. for pain  . metoprolol tartrate (LOPRESSOR) 25 MG tablet TAKE 1 TABLET BY MOUTH TWICE A DAY  . naproxen (NAPROSYN) 500 MG tablet TAKE 1 TABLET BY MOUTH TWICE A DAY WITH FOOD  . norethindrone-ethinyl estradiol-FE (JUNEL FE 1/20) 1-20 MG-MCG tablet Take 1 tablet by mouth daily.  . NURTEC 75 MG TBDP TAKE 75 MG BY MOUTH AS NEEDED (TAKE 1 TABLET AT  ONSET OF HEADACHE).  Marland Kitchen OVER THE COUNTER MEDICATION Gummy multi vit  . OVER THE COUNTER MEDICATION Gummy digestive advantage  . PRESCRIPTION MEDICATION Dialstat  . prochlorperazine (COMPAZINE) 10 MG tablet Take 1 tablet (10 mg total) by mouth every 8 (eight) hours as needed for nausea or vomiting.  . rizatriptan (MAXALT-MLT) 10 MG disintegrating tablet Take 1 tablet (10 mg total) by mouth as needed. May repeat in 2 hours if needed  . topiramate (TOPAMAX) 100 MG tablet Take 1 tablet (100 mg total) by mouth 2 (two) times daily.  . [DISCONTINUED] Vitamin D, Ergocalciferol, (DRISDOL) 1.25 MG (50000 UNIT) CAPS capsule TAKE 1 CAPSULE (50,000 UNITS TOTAL) BY MOUTH EVERY 7 (SEVEN) DAYS   No facility-administered medications prior to visit.    ROS        Objective:     BP 113/70   Pulse 96   Temp 98.3 F (36.8 C) (Temporal)   Ht 5' 4.5" (1.638 m)   Wt 234 lb 6 oz (106.3 kg)   SpO2 96%   BMI 39.61 kg/m  {Vitals History (Optional):23777}  Physical Exam   No results found for any visits on 05/29/23. {Show previous labs (optional):23779}    Assessment & Plan:    Routine Health Maintenance and Physical Exam  Immunization History  Administered Date(s) Administered  . DTaP 05/25/2000, 07/24/2000, 10/04/2000, 07/25/2001, 03/26/2004  . HIB (PRP-OMP) 05/25/2000, 07/24/2000, 10/04/2000, 04/09/2001  . Hepatitis A 03/29/2005, 01/14/2006  . Hepatitis B 2000/02/23, 05/25/2000, 10/04/2000  . IPV 05/25/2000, 07/24/2000, 10/04/2000, 03/26/2004  . MMR 07/25/2001, 03/26/2004  . Meningococcal Conjugate 04/06/2011, 04/15/2016  . Tdap 04/06/2011, 05/25/2021  . Varicella 04/09/2001, 12/06/2006    Health Maintenance  Topic Date Due  . INFLUENZA VACCINE  Never done  . Cervical Cancer Screening (Pap smear)  05/28/2024 (Originally 05/27/2023)  . HPV VACCINES (1 - 3-dose series) 05/28/2024 (Originally 03/25/2015)  . Hepatitis C Screening  05/28/2024 (Originally 03/24/2018)  . HIV Screening   05/28/2024 (Originally 03/25/2015)  . COVID-19 Vaccine (1 - 2023-24 season) 06/13/2024 (Originally 05/14/2023)  . DTaP/Tdap/Td (8 - Td or Tdap) 05/26/2031    Discussed health benefits of physical activity, and encouraged her to engage in regular exercise appropriate for her age and condition.  Problem List Items Addressed This Visit       Digestive   Gastroesophageal reflux disease     Endocrine   Subclinical hypothyroidism     Other

## 2023-05-30 DIAGNOSIS — R0781 Pleurodynia: Secondary | ICD-10-CM | POA: Diagnosis not present

## 2023-05-30 DIAGNOSIS — I4711 Inappropriate sinus tachycardia, so stated: Secondary | ICD-10-CM | POA: Diagnosis not present

## 2023-05-30 DIAGNOSIS — I371 Nonrheumatic pulmonary valve insufficiency: Secondary | ICD-10-CM | POA: Diagnosis not present

## 2023-05-30 DIAGNOSIS — Q245 Malformation of coronary vessels: Secondary | ICD-10-CM | POA: Diagnosis not present

## 2023-06-01 ENCOUNTER — Other Ambulatory Visit: Payer: Managed Care, Other (non HMO)

## 2023-06-01 ENCOUNTER — Encounter: Payer: Self-pay | Admitting: Family Medicine

## 2023-06-01 DIAGNOSIS — K219 Gastro-esophageal reflux disease without esophagitis: Secondary | ICD-10-CM

## 2023-06-01 DIAGNOSIS — E038 Other specified hypothyroidism: Secondary | ICD-10-CM

## 2023-06-01 DIAGNOSIS — J301 Allergic rhinitis due to pollen: Secondary | ICD-10-CM | POA: Insufficient documentation

## 2023-06-01 DIAGNOSIS — M62838 Other muscle spasm: Secondary | ICD-10-CM | POA: Insufficient documentation

## 2023-06-01 DIAGNOSIS — E559 Vitamin D deficiency, unspecified: Secondary | ICD-10-CM

## 2023-06-01 DIAGNOSIS — F419 Anxiety disorder, unspecified: Secondary | ICD-10-CM | POA: Insufficient documentation

## 2023-06-01 DIAGNOSIS — E782 Mixed hyperlipidemia: Secondary | ICD-10-CM

## 2023-06-01 DIAGNOSIS — F322 Major depressive disorder, single episode, severe without psychotic features: Secondary | ICD-10-CM | POA: Insufficient documentation

## 2023-06-01 LAB — BAYER DCA HB A1C WAIVED: HB A1C (BAYER DCA - WAIVED): 4.2 % — ABNORMAL LOW (ref 4.8–5.6)

## 2023-06-01 LAB — LIPID PANEL

## 2023-06-02 ENCOUNTER — Other Ambulatory Visit: Payer: Self-pay | Admitting: *Deleted

## 2023-06-02 ENCOUNTER — Other Ambulatory Visit: Payer: Self-pay | Admitting: Family Medicine

## 2023-06-02 DIAGNOSIS — E039 Hypothyroidism, unspecified: Secondary | ICD-10-CM

## 2023-06-02 LAB — CBC WITH DIFFERENTIAL/PLATELET
Basophils Absolute: 0.1 10*3/uL (ref 0.0–0.2)
Basos: 1 %
EOS (ABSOLUTE): 0.2 10*3/uL (ref 0.0–0.4)
Eos: 2 %
Hematocrit: 44.5 % (ref 34.0–46.6)
Hemoglobin: 14 g/dL (ref 11.1–15.9)
Immature Grans (Abs): 0 10*3/uL (ref 0.0–0.1)
Immature Granulocytes: 0 %
Lymphocytes Absolute: 2.7 10*3/uL (ref 0.7–3.1)
Lymphs: 28 %
MCH: 30.4 pg (ref 26.6–33.0)
MCHC: 31.5 g/dL (ref 31.5–35.7)
MCV: 97 fL (ref 79–97)
Monocytes Absolute: 0.7 10*3/uL (ref 0.1–0.9)
Monocytes: 7 %
Neutrophils Absolute: 6 10*3/uL (ref 1.4–7.0)
Neutrophils: 62 %
Platelets: 305 10*3/uL (ref 150–450)
RBC: 4.61 x10E6/uL (ref 3.77–5.28)
RDW: 11.6 % — ABNORMAL LOW (ref 11.7–15.4)
WBC: 9.7 10*3/uL (ref 3.4–10.8)

## 2023-06-02 LAB — LIPID PANEL
Chol/HDL Ratio: 4.6 ratio — ABNORMAL HIGH (ref 0.0–4.4)
Cholesterol, Total: 225 mg/dL — ABNORMAL HIGH (ref 100–199)
HDL: 49 mg/dL (ref >39)
LDL Chol Calc (NIH): 114 mg/dL — ABNORMAL HIGH (ref 0–99)
Triglycerides: 357 mg/dL — ABNORMAL HIGH (ref 0–149)
VLDL Cholesterol Cal: 62 mg/dL — ABNORMAL HIGH (ref 5–40)

## 2023-06-02 LAB — CMP14+EGFR
ALT: 7 IU/L (ref 0–32)
AST: 11 IU/L (ref 0–40)
Albumin: 4 g/dL (ref 4.0–5.0)
Alkaline Phosphatase: 99 IU/L (ref 44–121)
BUN/Creatinine Ratio: 17 (ref 9–23)
BUN: 12 mg/dL (ref 6–20)
Bilirubin Total: 0.3 mg/dL (ref 0.0–1.2)
CO2: 21 mmol/L (ref 20–29)
Calcium: 9.7 mg/dL (ref 8.7–10.2)
Chloride: 100 mmol/L (ref 96–106)
Creatinine, Ser: 0.72 mg/dL (ref 0.57–1.00)
Globulin, Total: 3 g/dL (ref 1.5–4.5)
Glucose: 76 mg/dL (ref 70–99)
Potassium: 4.3 mmol/L (ref 3.5–5.2)
Sodium: 136 mmol/L (ref 134–144)
Total Protein: 7 g/dL (ref 6.0–8.5)
eGFR: 120 mL/min/{1.73_m2} (ref >59)

## 2023-06-02 LAB — VITAMIN D 25 HYDROXY (VIT D DEFICIENCY, FRACTURES): Vit D, 25-Hydroxy: 52.9 ng/mL (ref 30.0–100.0)

## 2023-06-02 LAB — TSH: TSH: 11.2 u[IU]/mL — ABNORMAL HIGH (ref 0.450–4.500)

## 2023-06-02 LAB — T4, FREE: Free T4: 0.98 ng/dL (ref 0.82–1.77)

## 2023-06-02 MED ORDER — LEVOTHYROXINE SODIUM 50 MCG PO TABS
50.0000 ug | ORAL_TABLET | Freq: Every day | ORAL | 3 refills | Status: DC
Start: 2023-06-02 — End: 2023-07-17

## 2023-06-18 ENCOUNTER — Other Ambulatory Visit: Payer: Self-pay | Admitting: Neurology

## 2023-06-18 ENCOUNTER — Other Ambulatory Visit: Payer: Self-pay | Admitting: Family Medicine

## 2023-06-18 DIAGNOSIS — K219 Gastro-esophageal reflux disease without esophagitis: Secondary | ICD-10-CM

## 2023-07-04 NOTE — Progress Notes (Unsigned)
Patient: Claire Stewart. [place] Date of Birth: 2001/09/06  Reason for Visit: Follow up History from: Patient, mother Primary Neurologist: Terrace Arabia  ASSESSMENT AND PLAN 22 y.o. year old female   1.  Neonatal seizure, intracranial hemorrhage 2.  Complex partial seizure with secondary generalization 3.  Chronic migraine headaches  -Extensive conversation about migraine management -For now, will remain off preventative migraine medication -For rescue: Continue Nurtec, if Nurtec is not helpful, will do cocktail of Maxalt, meloxicam, Compazine -Keep log of migraine frequency  -Continue Keppra for seizure prevention -Next steps: CGRP for migraine prevention -Tried and failed: Topamax (breakthrough menstrual bleeding), already on metoprolol -We reviewed MRI of the brain imaging from 2021.  They would like to see Dr. Terrace Arabia in 1 year for follow-up  HISTORY  Claire Stewart is a 22 year old female, seen in request by her primary care nurse practitioner [name] evaluation of seizure, learning disability, migraine headache, she is accompanied by her mother at today's clinical visit on February 27, 2020   I reviewed and summarized the referring note. She moved from [place] to [place] recently, was previously under the care of neurologist for seizure, migraine headaches   She was born full-term, but suffered jaundice, at 31 days old, she suffered prolonged seizure, later noted developmental delay, was found to have significant visual impairment, she has been homeschooled,   She was treated with antiepileptic medication as an infant following her initial prolonged seizure, but medicine was tapered off,   By 22 years old, she was noted to have frequent staring off spells, eventually was diagnosed with partial seizure, has been treated with Keppra titrating dose, currently taking extended release 750 mg 4 tablets at nighttime, works well for her, last seizure was on May 12, 2015, she had a sudden  onset of staring spells,   She has a long history of migraine headaches, now having 1-3 times each month, taking Topamax 50/100 mg as preventive medication, Imitrex works most of the time, but sometimes her headache is so severe, with significant light noise sensitivity, Imitrex would not work for those prolonged severe migraine headaches   She is legally blind,can read large print only,   UPDATE Aug 31 2020: She is accompanied by her mother at today's clinical visit, She had no recurrent seizure, tolerating Keppra xr 750 mg 4 tablets every night   She continue has frequent migraine headaches, often preceded by blurry vision, about once a week, as to be absent from her home schooling, Maxalt dissolvable only provide limited help, felt Imitrex nasal spray works better for her in the past   We personally reviewed MRI of the brain with and without contrast in July 2021, bilateral occipital, parietal occipital cystic encephalomalacia, gliosis, no acute abnormality   EEG was normal in July 2021   UPDATE April 07 2021: She still does home schooling, overall doing very well, like Keppra XR 500 mg 4 tablets every night, had no recurrent seizure for few years, she is dealing with endometriosis, has frequent lower abdominal pain, she also has occasionally migraine, couple times each month, Imitrex works well, but she preferred to go back Maxalt dissolvable over Imitrex subcutaneous injection, felt the benefit has not significant difference   Update April 07, 2022 SS: Here with her mom, remains on Keppra XR 750 mg, 4 tablets daily, Topamax 100 mg twice a day.  Takes Maxalt for acute headache.  Migraines improved after daith piercing. Has about 1 migraine a month, can last 1-4 days.  Takes Maxalt, Compazine, Meloxicam. Just finished high school.  On new birth control for endometriosis, break through bleeding, no bleeding for 3 months, wonder about weaning off Topamax?   Update October 12, 2022 SS: Via VV, 1  migraine a month, in Dec/Jan 2 migraines a month due to weather change. Tried Nurtec, it helped, also has Maxalt, utilizes both for different types of headaches. Continues with breakthrough bleeding around her menstrual cycle. Has tried 3 different birth control. Remains on Keppra XR 750 XR 4 tablets daily. Topamax has been used to migraine prevention.   Update July 05, 2023 SS: has stopped Topamax due to breakthrough menstrual bleeding. Migraine log: August-8, September-2, October-1. Started on synthroid recently, migraines have been better.  No seizures, remains on Keppra.  Has cocktail of rescue medication to include Nurtec, Maxalt melt, meloxicam, Compazine.  She wanted to review detailed MRI.  REVIEW OF SYSTEMS: Out of a complete 14 system review of symptoms, the patient complains only of the following symptoms, and all other reviewed systems are negative.  See HPI  ALLERGIES: Allergies  Allergen Reactions   Sulfa Antibiotics Hives   Levetiracetam Other (See Comments)    Can not have brand name- [employer]- caused nerve pain     HOME MEDICATIONS: Outpatient Medications Prior to Visit  Medication Sig Dispense Refill   cyclobenzaprine (FEXMID) 7.5 MG tablet Take 1 tablet (7.5 mg total) by mouth 3 (three) times daily as needed for muscle spasms. 90 tablet 11   diltiazem (CARDIZEM) 30 MG tablet Take 1 tablet (30 mg total) by mouth every 8 (eight) hours as needed. 30 tablet 0   FIBER PO Take 1 tablet by mouth daily.     levETIRAcetam (KEPPRA XR) 750 MG 24 hr tablet Take 4 tablets (3,000 mg total) by mouth daily. Take 4 tablets at night 360 tablet 4   levocetirizine (XYZAL) 5 MG tablet Take 1 tablet (5 mg total) by mouth every evening. 90 tablet 3   levothyroxine (SYNTHROID) 50 MCG tablet Take 1 tablet (50 mcg total) by mouth daily. 90 tablet 3   magnesium gluconate (MAGONATE) 500 MG tablet Take 500 mg by mouth daily.     meloxicam (MOBIC) 7.5 MG tablet Take 1 tablet (7.5 mg total) by  mouth daily as needed. for pain 20 tablet 2   metoprolol tartrate (LOPRESSOR) 25 MG tablet TAKE 1 TABLET BY MOUTH TWICE A DAY 180 tablet 1   naproxen (NAPROSYN) 500 MG tablet TAKE 1 TABLET BY MOUTH TWICE A DAY WITH FOOD 60 tablet 2   norethindrone-ethinyl estradiol-FE (JUNEL FE 1/20) 1-20 MG-MCG tablet Take 1 tablet by mouth daily. 84 tablet 3   NURTEC 75 MG TBDP TAKE 75 MG BY MOUTH AS NEEDED (TAKE 1 TABLET AT ONSET OF HEADACHE). 8 tablet 11   OVER THE COUNTER MEDICATION Gummy multi vit     OVER THE COUNTER MEDICATION Gummy digestive advantage     PRESCRIPTION MEDICATION Dialstat     prochlorperazine (COMPAZINE) 10 MG tablet Take 1 tablet (10 mg total) by mouth every 8 (eight) hours as needed for nausea or vomiting. 10 tablet 3   Riboflavin (VITAMIN B-2 PO) Take 1 tablet by mouth every 7 (seven) days.     rizatriptan (MAXALT-MLT) 10 MG disintegrating tablet TAKE 1 TABLET (10 MG TOTAL) BY MOUTH AS NEEDED MAY REPEAT IN 2 HOURS IF NEEDED 12 tablet 11   topiramate (TOPAMAX) 100 MG tablet Take 1 tablet (100 mg total) by mouth 2 (two) times daily. 180 tablet  4   No facility-administered medications prior to visit.    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"   Cortical visual impairment    Depression    Elevated TSH 05/26/2021   Epilepsy (HCC)    Learning disabilities    global language learning disability   Legally blind    Migraines    Mixed hyperlipidemia 05/26/2021   Sensory processing difficulty    Short-term memory loss    Vitamin D deficiency 05/27/2022    PAST SURGICAL HISTORY: Past Surgical History:  Procedure Laterality Date   MOUTH SURGERY     WISDOM TOOTH EXTRACTION      FAMILY HISTORY: Family History  Problem  Relation Age of Onset   Hyperlipidemia Mother    Allergies Mother    ADD / ADHD Mother    Depression Mother    Arthritis Mother    Alcohol abuse Father    Drug abuse Father    Depression Father    Thyroid disease Father    Allergies Brother    ADD / ADHD Brother    Cancer Paternal Aunt    Drug abuse Maternal Grandmother    Diabetes Maternal Grandmother    Bipolar disorder Maternal Grandmother    Seizures Maternal Grandmother    Migraines Maternal Grandmother    Hypertension Maternal Grandfather    Hyperlipidemia Maternal Grandfather    Depression Maternal Grandfather    Arthritis Maternal Grandfather    Breast cancer Cousin     SOCIAL HISTORY: Social History   Socioeconomic History   Marital status: Single    Spouse name: Not on file   Number of children: 0   Years of education: 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   Right-handed.   No daily use of caffeine.   Social Determinants of Health   Financial Resource Strain: Not on file  Food Insecurity: Not on file  Transportation Needs: Not on file  Physical Activity: Not on file  Stress: Not on file  Social Connections: Not on file  Intimate Partner Violence: Not on file   PHYSICAL EXAM  Vitals:   07/05/23 0949  BP: 126/89  Pulse: 78  Weight: 230 lb 12.8 oz (104.7 kg)  Height: 5\' 5"  (1.651 m)   Body mass index is 38.41 kg/m.  Generalized: Well developed, in no acute distress  Neurological examination  Mentation: Alert, most history is provided by her mother, patient has cognitive impairment, but is interactive  and participatory during exam. Cranial nerve II-XII: Pupils were equal round reactive to light.  Poor vision. Facial sensation and strength were normal.  Head turning and shoulder shrug  were normal and symmetric. Motor: The motor testing reveals 5 over 5 strength of all 4 extremities.  Good symmetric motor tone is noted throughout.  Sensory: Sensory testing is intact to soft touch on all 4 extremities. No evidence of extinction is noted.  Coordination: Cerebellar testing reveals good finger-nose-finger and heel-to-shin bilaterally.  Gait and station: Gait is cautious, has a walking stick. Reflexes: Deep tendon reflexes are symmetric and normal bilaterally.   DIAGNOSTIC DATA (LABS, IMAGING, TESTING) - I reviewed patient records, labs, notes, testing and imaging myself where available.  Lab Results  Component Value Date   WBC 9.7 06/01/2023   HGB 14.0 06/01/2023   HCT 44.5 06/01/2023   MCV 97 06/01/2023   PLT 305 06/01/2023      Component Value Date/Time   NA 136 06/01/2023 0906   K 4.3 06/01/2023 0906   CL 100 06/01/2023 0906   CO2 21 06/01/2023 0906   GLUCOSE 76 06/01/2023 0906   GLUCOSE 81 01/06/2023 1548   BUN 12 06/01/2023 0906   CREATININE 0.72 06/01/2023 0906   CALCIUM 9.7 06/01/2023 0906   PROT 7.0 06/01/2023 0906   ALBUMIN 4.0 06/01/2023 0906   AST 11 06/01/2023 0906   ALT 7 06/01/2023 0906   ALKPHOS 99 06/01/2023 0906   BILITOT 0.3 06/01/2023 0906   GFRNONAA >60 01/06/2023 1548   GFRAA 120 02/27/2020 0941   Lab Results  Component Value Date   CHOL 225 (H) 06/01/2023   HDL 49 06/01/2023   LDLCALC 114 (H) 06/01/2023   TRIG 357 (H) 06/01/2023   CHOLHDL 4.6 (H) 06/01/2023   Lab Results  Component Value Date   HGBA1C 4.2 (L) 06/01/2023   No results found for: "VITAMINB12" Lab Results  Component Value Date   TSH 11.200 (H) 06/01/2023    Margie Ege, AGNP-C, DNP 07/05/2023, 10:07 AM [facility] [address] [place], [place]  [address] [phone]

## 2023-07-05 ENCOUNTER — Ambulatory Visit (INDEPENDENT_AMBULATORY_CARE_PROVIDER_SITE_OTHER): Payer: Managed Care, Other (non HMO) | Admitting: Neurology

## 2023-07-05 ENCOUNTER — Encounter: Payer: Self-pay | Admitting: Neurology

## 2023-07-05 VITALS — BP 126/89 | HR 78 | Ht 65.0 in | Wt 230.8 lb

## 2023-07-05 DIAGNOSIS — G40301 Generalized idiopathic epilepsy and epileptic syndromes, not intractable, with status epilepticus: Secondary | ICD-10-CM

## 2023-07-05 DIAGNOSIS — G43709 Chronic migraine without aura, not intractable, without status migrainosus: Secondary | ICD-10-CM | POA: Diagnosis not present

## 2023-07-05 MED ORDER — PROCHLORPERAZINE MALEATE 10 MG PO TABS: 10.0000 mg | ORAL_TABLET | Freq: Three times a day (TID) | ORAL | 3 refills | Status: AC | PRN

## 2023-07-05 MED ORDER — LEVETIRACETAM ER 750 MG PO TB24: 3000.0000 mg | ORAL_TABLET | Freq: Every day | ORAL | 4 refills | Status: DC

## 2023-07-05 MED ORDER — RIZATRIPTAN BENZOATE 10 MG PO TBDP: 10.0000 mg | ORAL_TABLET | ORAL | 11 refills | Status: DC | PRN

## 2023-07-05 MED ORDER — MELOXICAM 7.5 MG PO TABS: 7.5000 mg | ORAL_TABLET | Freq: Every day | ORAL | 2 refills | Status: DC | PRN

## 2023-07-05 NOTE — Patient Instructions (Signed)
Keep a log of migraines.  Continue current cocktail for acute migraine headache starting with Nurtec, if not helpful then proceed with cocktail of Maxalt, meloxicam, Compazine.  Continue Keppra for seizures.  If migraines increase let me know, we will start a preventative.  Follow-up in 1 year.  Thanks!!

## 2023-07-14 ENCOUNTER — Other Ambulatory Visit: Payer: Managed Care, Other (non HMO)

## 2023-07-14 DIAGNOSIS — E039 Hypothyroidism, unspecified: Secondary | ICD-10-CM

## 2023-07-15 LAB — T4, FREE: Free T4: 1.23 ng/dL (ref 0.82–1.77)

## 2023-07-15 LAB — TSH: TSH: 8.12 u[IU]/mL — ABNORMAL HIGH (ref 0.450–4.500)

## 2023-07-17 ENCOUNTER — Other Ambulatory Visit: Payer: Self-pay | Admitting: Family Medicine

## 2023-07-17 DIAGNOSIS — E039 Hypothyroidism, unspecified: Secondary | ICD-10-CM

## 2023-07-17 MED ORDER — LEVOTHYROXINE SODIUM 75 MCG PO TABS
75.0000 ug | ORAL_TABLET | Freq: Every day | ORAL | 3 refills | Status: DC
Start: 2023-07-17 — End: 2024-02-02

## 2023-07-18 ENCOUNTER — Ambulatory Visit: Payer: Managed Care, Other (non HMO) | Admitting: Neurology

## 2023-07-27 ENCOUNTER — Other Ambulatory Visit: Payer: Self-pay | Admitting: Family Medicine

## 2023-07-27 DIAGNOSIS — K219 Gastro-esophageal reflux disease without esophagitis: Secondary | ICD-10-CM

## 2023-07-27 DIAGNOSIS — E559 Vitamin D deficiency, unspecified: Secondary | ICD-10-CM

## 2023-08-09 ENCOUNTER — Other Ambulatory Visit: Payer: Self-pay | Admitting: Family Medicine

## 2023-08-09 DIAGNOSIS — Z30011 Encounter for initial prescription of contraceptive pills: Secondary | ICD-10-CM

## 2023-08-17 ENCOUNTER — Encounter: Payer: Self-pay | Admitting: Family Medicine

## 2023-08-17 ENCOUNTER — Other Ambulatory Visit: Payer: Self-pay | Admitting: Family Medicine

## 2023-08-17 DIAGNOSIS — K219 Gastro-esophageal reflux disease without esophagitis: Secondary | ICD-10-CM

## 2023-08-17 MED ORDER — OMEPRAZOLE 20 MG PO CPDR: 20.0000 mg | DELAYED_RELEASE_CAPSULE | Freq: Every day | ORAL | 3 refills | Status: DC

## 2023-08-18 ENCOUNTER — Other Ambulatory Visit: Payer: Self-pay | Admitting: Internal Medicine

## 2023-08-18 ENCOUNTER — Other Ambulatory Visit: Payer: Self-pay | Admitting: Neurology

## 2023-08-21 NOTE — Telephone Encounter (Signed)
Last note didn't say anything about staying on topirimate: Keep a log of migraines.  Continue current cocktail for acute migraine headache starting with Nurtec, if not helpful then proceed with cocktail of Maxalt, meloxicam, Compazine.  Continue Keppra for seizures.  If migraines increase let me know, we will start a preventative.  Follow-up in 1 year.  Thanks!!

## 2023-09-11 ENCOUNTER — Encounter: Payer: Self-pay | Admitting: Family Medicine

## 2023-09-12 DIAGNOSIS — S92254A Nondisplaced fracture of navicular [scaphoid] of right foot, initial encounter for closed fracture: Secondary | ICD-10-CM | POA: Diagnosis not present

## 2023-09-14 ENCOUNTER — Ambulatory Visit: Payer: Managed Care, Other (non HMO) | Admitting: Family Medicine

## 2023-09-15 ENCOUNTER — Other Ambulatory Visit: Payer: Self-pay | Admitting: Family Medicine

## 2023-09-15 DIAGNOSIS — J301 Allergic rhinitis due to pollen: Secondary | ICD-10-CM

## 2023-09-15 MED ORDER — MONTELUKAST SODIUM 10 MG PO TABS: 10.0000 mg | ORAL_TABLET | Freq: Every day | ORAL | 3 refills | Status: DC

## 2023-09-16 ENCOUNTER — Other Ambulatory Visit: Payer: Self-pay | Admitting: Family Medicine

## 2023-09-16 ENCOUNTER — Other Ambulatory Visit: Payer: Self-pay | Admitting: Internal Medicine

## 2023-09-16 DIAGNOSIS — E559 Vitamin D deficiency, unspecified: Secondary | ICD-10-CM

## 2023-09-20 DIAGNOSIS — S92251A Displaced fracture of navicular [scaphoid] of right foot, initial encounter for closed fracture: Secondary | ICD-10-CM | POA: Diagnosis not present

## 2023-10-10 DIAGNOSIS — Z79899 Other long term (current) drug therapy: Secondary | ICD-10-CM | POA: Diagnosis not present

## 2023-10-10 DIAGNOSIS — G40909 Epilepsy, unspecified, not intractable, without status epilepticus: Secondary | ICD-10-CM | POA: Diagnosis not present

## 2023-10-10 DIAGNOSIS — I4711 Inappropriate sinus tachycardia, so stated: Secondary | ICD-10-CM | POA: Diagnosis not present

## 2023-10-11 ENCOUNTER — Encounter: Payer: Self-pay | Admitting: Family Medicine

## 2023-10-11 ENCOUNTER — Ambulatory Visit (INDEPENDENT_AMBULATORY_CARE_PROVIDER_SITE_OTHER): Payer: Managed Care, Other (non HMO) | Admitting: Family Medicine

## 2023-10-11 VITALS — BP 122/84 | HR 92 | Temp 98.5°F | Ht 65.0 in | Wt 237.8 lb

## 2023-10-11 DIAGNOSIS — Z8742 Personal history of other diseases of the female genital tract: Secondary | ICD-10-CM | POA: Diagnosis not present

## 2023-10-11 DIAGNOSIS — Z124 Encounter for screening for malignant neoplasm of cervix: Secondary | ICD-10-CM | POA: Diagnosis not present

## 2023-10-11 DIAGNOSIS — E039 Hypothyroidism, unspecified: Secondary | ICD-10-CM

## 2023-10-11 NOTE — Progress Notes (Signed)
   Acute Office Visit  Subjective:     Patient ID: Claire Stewart. Mckiss[place], female    DOB: 06/03/2001, 23 y.o.   MRN: [id number]  Chief Complaint  Patient presents with   Gynecologic Exam    Gynecologic Exam   Patient is in today for a GYN exam. She had a pap in 2023 with ASC-US. She has not had a repeat since. She has not been sexually active before. Her previous pap was very uncomfortable for her. She would prefer to be referred to GYN to have more speculum options to ease discomfort as much as possible.   She does need to repeat her TSH level. Last TSH was elevated and her levothyroxine dosage was increased to 75 mcg. This was over 3 months ago. No symptoms.    ROS As per HPI.      Objective:    BP 122/84   Pulse 92   Temp 98.5 F (36.9 C) (Temporal)   Ht 5\' 5"  (1.651 m)   Wt 237 lb 12.8 oz (107.9 kg)   SpO2 97%   BMI 39.57 kg/m    Physical Exam Vitals reviewed.  Constitutional:      General: She is not in acute distress.    Appearance: She is not ill-appearing, toxic-appearing or diaphoretic.  Cardiovascular:     Rate and Rhythm: Normal rate and regular rhythm.     Heart sounds: Normal heart sounds. No murmur heard. Pulmonary:     Effort: Pulmonary effort is normal. No respiratory distress.     Breath sounds: Normal breath sounds. No wheezing.  Skin:    General: Skin is warm.  Neurological:     Mental Status: She is alert and oriented to person, place, and time. Mental status is at baseline.  Psychiatric:        Mood and Affect: Mood normal.        Behavior: Behavior normal.     No results found for any visits on 10/11/23.      Assessment & Plan:   [name] was seen today for gynecologic exam.  Diagnoses and all orders for this visit:  Acquired hypothyroidism Last TSH not therapeutic. Repeat TSH level pending following dose change.  -     TSH + free T4  History of abnormal cervical Pap smear Screening for cervical cancer ASC-US on pap in 2023. Prefers  referral. Referral placed.  -     Ambulatory referral to Gynecology  Will determine follow up pending lab results.   The patient indicates understanding of these issues and agrees with the plan.  Gabriel Earing, FNP

## 2023-10-12 ENCOUNTER — Encounter: Payer: Self-pay | Admitting: Family Medicine

## 2023-10-12 LAB — TSH+FREE T4
Free T4: 1.25 ng/dL (ref 0.82–1.77)
TSH: 3.43 u[IU]/mL (ref 0.450–4.500)

## 2023-10-19 ENCOUNTER — Other Ambulatory Visit: Payer: Self-pay | Admitting: Family Medicine

## 2023-10-19 ENCOUNTER — Other Ambulatory Visit: Payer: Self-pay | Admitting: Neurology

## 2023-10-19 DIAGNOSIS — E559 Vitamin D deficiency, unspecified: Secondary | ICD-10-CM

## 2023-10-19 DIAGNOSIS — Z30011 Encounter for initial prescription of contraceptive pills: Secondary | ICD-10-CM

## 2023-10-20 DIAGNOSIS — S92251A Displaced fracture of navicular [scaphoid] of right foot, initial encounter for closed fracture: Secondary | ICD-10-CM | POA: Diagnosis not present

## 2023-10-24 ENCOUNTER — Telehealth: Payer: Self-pay | Admitting: Family Medicine

## 2023-10-24 DIAGNOSIS — Z30011 Encounter for initial prescription of contraceptive pills: Secondary | ICD-10-CM

## 2023-10-24 NOTE — Telephone Encounter (Unsigned)
Copied from CRM [id number]. Topic: Clinical - Prescription Issue >> Oct 24, 2023 12:32 PM Antony Haste wrote: Reason for CRM: The patient's mother states her daughter's birth control instructions is wrong and her insurance will not cover this refill, the mother of this patient is requesting for this to be updated to include: Take 1 tablet by mouth every day continuously. She states the pharmacy has denied her refill for this reason and Brezlyn will be out of her birth control by tomorrow, she is wanting to have this re-sent by the end of the day if possible.

## 2023-10-25 ENCOUNTER — Other Ambulatory Visit: Payer: Self-pay | Admitting: Family Medicine

## 2023-10-25 DIAGNOSIS — Z30011 Encounter for initial prescription of contraceptive pills: Secondary | ICD-10-CM

## 2023-10-25 MED ORDER — NORETHIN ACE-ETH ESTRAD-FE 1-20 MG-MCG PO TABS: 1.0000 | ORAL_TABLET | Freq: Every day | ORAL | 3 refills | Status: DC

## 2023-10-25 NOTE — Telephone Encounter (Signed)
It is written for her to take daily. She is not taking the inactive pills?

## 2023-10-25 NOTE — Telephone Encounter (Signed)
Added to Rx

## 2023-10-25 NOTE — Telephone Encounter (Signed)
Script needs to say continuously on it so that insurance will pay for it

## 2023-10-25 NOTE — Addendum Note (Signed)
Addended by: Gabriel Earing on: 10/25/2023 09:47 AM   Modules accepted: Orders

## 2023-10-27 DIAGNOSIS — M79671 Pain in right foot: Secondary | ICD-10-CM | POA: Diagnosis not present

## 2023-10-27 DIAGNOSIS — S92251A Displaced fracture of navicular [scaphoid] of right foot, initial encounter for closed fracture: Secondary | ICD-10-CM | POA: Diagnosis not present

## 2023-10-27 DIAGNOSIS — M25774 Osteophyte, right foot: Secondary | ICD-10-CM | POA: Diagnosis not present

## 2023-11-08 DIAGNOSIS — M79671 Pain in right foot: Secondary | ICD-10-CM | POA: Diagnosis not present

## 2023-11-08 DIAGNOSIS — S92251A Displaced fracture of navicular [scaphoid] of right foot, initial encounter for closed fracture: Secondary | ICD-10-CM | POA: Diagnosis not present

## 2023-11-09 DIAGNOSIS — I4711 Inappropriate sinus tachycardia, so stated: Secondary | ICD-10-CM | POA: Diagnosis not present

## 2023-11-13 ENCOUNTER — Other Ambulatory Visit (HOSPITAL_COMMUNITY)
Admission: RE | Admit: 2023-11-13 | Discharge: 2023-11-13 | Disposition: A | Discharge status: Home / Self Care | Source: Ambulatory Visit | Attending: Advanced Practice Midwife | Admitting: Advanced Practice Midwife

## 2023-11-13 ENCOUNTER — Ambulatory Visit (INDEPENDENT_AMBULATORY_CARE_PROVIDER_SITE_OTHER): Payer: Managed Care, Other (non HMO) | Admitting: Advanced Practice Midwife

## 2023-11-13 ENCOUNTER — Encounter: Payer: Self-pay | Admitting: Advanced Practice Midwife

## 2023-11-13 VITALS — BP 118/78 | HR 67 | Ht 65.0 in | Wt 227.0 lb

## 2023-11-13 DIAGNOSIS — Z01419 Encounter for gynecological examination (general) (routine) without abnormal findings: Secondary | ICD-10-CM | POA: Insufficient documentation

## 2023-11-13 NOTE — Progress Notes (Signed)
 Claire Stewart 23 y.o.  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"   Cortical visual impairment    Depression    Elevated TSH 05/26/2021   Epilepsy (HCC)    Learning disabilities    global language learning disability   Legally blind    Migraines    Mixed hyperlipidemia 05/26/2021   Sensory processing difficulty    Short-term memory loss    Vaginal Pap smear, abnormal    Vitamin D deficiency 05/27/2022    Past Surgical History: Past Surgical History:  Procedure Laterality Date   MOUTH SURGERY     WISDOM TOOTH EXTRACTION      Family History: Family History  Problem Relation Age of Onset   Hyperlipidemia Mother    Allergies Mother    ADD / ADHD Mother    Depression Mother    Arthritis Mother    Alcohol abuse Father    Drug abuse Father    Depression Father    Thyroid disease Father    Allergies Brother    ADD / ADHD Brother    Cancer Paternal Aunt    Drug abuse Maternal Grandmother    Diabetes Maternal Grandmother    Bipolar disorder Maternal Grandmother    Seizures Maternal Grandmother    Migraines Maternal Grandmother    Hypertension Maternal Grandfather    Hyperlipidemia Maternal Grandfather    Depression Maternal Grandfather    Arthritis Maternal Grandfather    Breast cancer Cousin     Social History: Social History   Tobacco Use   Smoking status: Never   Smokeless tobacco: Never  Vaping Use   Vaping status: Never Used  Substance Use Topics   Alcohol use: Never   Drug use: Never    Allergies:  Allergies  Allergen Reactions   Sulfa Antibiotics Hives   Levetiracetam  Other (See Comments)    Can not have brand name- [employer]- caused nerve pain       Current Outpatient Medications:    diltiazem (CARDIZEM) 30 MG tablet, Take 1 tablet (30 mg total) by mouth every 8 (eight) hours as needed., Disp: 30 tablet, Rfl: 0   FIBER PO, Take 1 tablet by mouth daily., Disp: , Rfl:    levETIRAcetam (KEPPRA XR) 750 MG 24 hr tablet, Take 4 tablets (3,000 mg total) by mouth daily. Take 4 tablets at night, Disp: 360 tablet, Rfl: 4   levothyroxine (SYNTHROID) 75 MCG tablet, Take 1 tablet (75 mcg total) by mouth daily before breakfast., Disp: 90 tablet, Rfl: 3   magnesium gluconate (MAGONATE) 500 MG tablet, Take 500 mg by mouth daily., Disp: , Rfl:    meloxicam (MOBIC) 7.5 MG tablet, Take 1 tablet (7.5 mg total) by mouth daily as needed. for pain, Disp: 20 tablet, Rfl: 2   metoprolol tartrate (LOPRESSOR) 25 MG tablet, TAKE 1 TABLET BY MOUTH TWICE A DAY, Disp: 60 tablet, Rfl: 0   montelukast (SINGULAIR) 10 MG tablet, Take 1 tablet (10 mg total) by mouth at bedtime., Disp: 90 tablet, Rfl:  3   naproxen (NAPROSYN) 500 MG tablet, TAKE 1 TABLET BY MOUTH TWICE A DAY WITH FOOD, Disp: 60 tablet, Rfl: 2   norethindrone-ethinyl estradiol-FE (JUNEL FE 1/20) 1-20 MG-MCG tablet, Take 1 tablet by mouth daily. Take continuously., Disp: 90 tablet, Rfl: 3   NURTEC 75 MG TBDP, TAKE 75 MG BY MOUTH AS NEEDED (TAKE 1 TABLET AT ONSET OF HEADACHE)., Disp: 8 tablet, Rfl: 11   omeprazole (PRILOSEC) 20 MG capsule, Take 1 capsule (20 mg total) by mouth daily., Disp: 90 capsule, Rfl: 3   OVER THE COUNTER MEDICATION, Gummy multi vit, Disp: , Rfl:    OVER THE COUNTER MEDICATION, Gummy digestive advantage, Disp: , Rfl:    prochlorperazine (COMPAZINE) 10 MG tablet, Take 1 tablet (10 mg total) by mouth every 8 (eight) hours as needed for nausea or vomiting., Disp: 10 tablet, Rfl: 3   rizatriptan (MAXALT-MLT) 10 MG disintegrating tablet, Take 1 tablet (10 mg total) by mouth as needed. May repeat in 2 hours if  needed, Disp: 12 tablet, Rfl: 11   Vitamin D, Ergocalciferol, (DRISDOL) 1.25 MG (50000 UNIT) CAPS capsule, Take 50,000 Units by mouth once a week., Disp: , Rfl:    cyclobenzaprine (FEXMID) 7.5 MG tablet, Take 1 tablet (7.5 mg total) by mouth 3 (three) times daily as needed for muscle spasms., Disp: 90 tablet, Rfl: 11   levocetirizine (XYZAL) 5 MG tablet, Take 1 tablet (5 mg total) by mouth every evening. (Patient not taking: Reported on 11/13/2023), Disp: 90 tablet, Rfl: 3   PRESCRIPTION MEDICATION, Dialstat (Patient not taking: Reported on 11/13/2023), Disp: , Rfl:    Riboflavin (VITAMIN B-2 PO), Take 1 tablet by mouth every 7 (seven) days. (Patient not taking: Reported on 11/13/2023), Disp: , Rfl:   History of Present Illness: Here for repeat pap. Has never been sexually active.  Working w/PCP to (hopefully) become amenorrheic w/ COCs d/t ? Endometriosis (very painful periods) but still has BTB.  PCP manages labs, declines any labs today  Prior cytology:      Component Value Date/Time   DIAGPAP (A) 05/26/2022 1144    - Atypical squamous cells of undetermined significance (ASC-US)   ADEQPAP  05/26/2022 1144    Satisfactory for evaluation; transformation zone component PRESENT.      Review of Systems   Patient denies any headaches, blurred vision, shortness of breath, chest pain, abdominal pain, problems with bowel movements, urination, or intercourse.   Physical Exam: General:  Well developed, well nourished, no acute distress Skin:  Warm and dry Neck:  Midline trachea Lungs; Clear to auscultation bilaterally Breast:  No dominant palpable mass, retraction, or nipple discharge Cardiovascular: Regular rate and rhythm Abdomen:  Soft, non tender, no hepatosplenomegaly Pelvic:  External genitalia is normal in appearance.  The vagina is normal in appearance.  The cervix is nulliparous  Uterus is felt to be normal size, shape, and contour.  No adnexal masses or tenderness noted. Exam limited by  habitus  Extremities:  No swelling or varicosities noted Psych:  No mood changes.     Impression: Normal GYN exam      Plan: If pap normal, repeat in 3 years

## 2023-11-13 NOTE — Addendum Note (Signed)
 Addended by: Moss Mc on: 11/13/2023 10:36 AM   Modules accepted: Orders

## 2023-11-16 DIAGNOSIS — M79671 Pain in right foot: Secondary | ICD-10-CM | POA: Diagnosis not present

## 2023-11-16 DIAGNOSIS — S92251A Displaced fracture of navicular [scaphoid] of right foot, initial encounter for closed fracture: Secondary | ICD-10-CM | POA: Diagnosis not present

## 2023-11-16 LAB — CYTOLOGY - PAP: Diagnosis: NEGATIVE

## 2023-11-23 ENCOUNTER — Other Ambulatory Visit (HOSPITAL_COMMUNITY): Payer: Self-pay

## 2023-11-27 DIAGNOSIS — M79671 Pain in right foot: Secondary | ICD-10-CM | POA: Diagnosis not present

## 2023-11-27 DIAGNOSIS — S92251A Displaced fracture of navicular [scaphoid] of right foot, initial encounter for closed fracture: Secondary | ICD-10-CM | POA: Diagnosis not present

## 2023-11-30 DIAGNOSIS — S92251D Displaced fracture of navicular [scaphoid] of right foot, subsequent encounter for fracture with routine healing: Secondary | ICD-10-CM | POA: Diagnosis not present

## 2023-12-04 DIAGNOSIS — G9389 Other specified disorders of brain: Secondary | ICD-10-CM | POA: Diagnosis not present

## 2023-12-04 DIAGNOSIS — G43909 Migraine, unspecified, not intractable, without status migrainosus: Secondary | ICD-10-CM | POA: Diagnosis not present

## 2023-12-05 DIAGNOSIS — M25571 Pain in right ankle and joints of right foot: Secondary | ICD-10-CM | POA: Diagnosis not present

## 2023-12-05 DIAGNOSIS — M79671 Pain in right foot: Secondary | ICD-10-CM | POA: Diagnosis not present

## 2023-12-08 ENCOUNTER — Other Ambulatory Visit (HOSPITAL_COMMUNITY): Payer: Self-pay

## 2023-12-08 ENCOUNTER — Telehealth: Payer: Self-pay

## 2023-12-08 NOTE — Telephone Encounter (Signed)
 Pharmacy Patient Advocate Encounter   Received notification from Fax that prior authorization for Nurtec 75MG  dispersible tablets is required/requested.   Insurance verification completed.   The patient is insured through [facility] .     Per test claim: Refill too soon. PA is not needed at this time. Medication was filled 12/06/2023. Next eligible fill date is 12/20/2023.  [employer] is secondary-PA laready approved thru 02/2024 for her primary insurance [employer].

## 2023-12-18 ENCOUNTER — Telehealth: Payer: Self-pay

## 2023-12-18 ENCOUNTER — Other Ambulatory Visit (HOSPITAL_COMMUNITY): Payer: Self-pay

## 2023-12-18 NOTE — Telephone Encounter (Signed)
     PA is not needed. PT can get med for $19.97, Medicaid will not allow a PA since PT has a primary insurance and it has been approved for her primary.

## 2023-12-18 NOTE — Telephone Encounter (Addendum)
 Pt's mother Adryana Mogensen requesting a prior authorization  for NURTEC 75 MG TBDP . [employer] is requesting a prior authorization for the medication.

## 2024-01-15 ENCOUNTER — Ambulatory Visit: Payer: Self-pay

## 2024-01-15 NOTE — Telephone Encounter (Signed)
 Copied from CRM [id number]. Topic: Clinical - Red Word Triage >> Jan 15, 2024  8:45 AM Carrielelia G wrote: Red Word that prompted transfer to Nurse Triage: right ankle injury in the beginning of the year, may have re injured pain now is at an  81  Chief Complaint: right ankle swollen Symptoms: painful Frequency: constant Pertinent Negatives: Patient denies fever, open areas, injury Disposition: [x] ED /[] Urgent Care (no appt availability in office) / [] Appointment(In office/virtual)/ []  [facility]/ [] Home Care/ [] Refused Recommended Disposition /[] [facility]/ []  Follow-up with PCP Additional Notes: instructed to go to the ER.  Pcp office updated.   Reason for Disposition  Can't stand (bear weight) or walk  Answer Assessment - Initial Assessment Questions 1. MECHANISM: "How did the injury happen?" (e.g., twisting injury, direct blow)      Right ankle injury, states does not know how it happened 2. ONSET: "When did the injury happen?" (Minutes or hours ago)      today 3. LOCATION: "Where is the injury located?"      Right ankle 4. APPEARANCE of INJURY: "What does the injury look like?"      Swollen and it hurts 5. WEIGHT-BEARING: "Can you put weight on that foot?" "Can you walk (four steps or more)?"       Not for long time 6. SIZE: For cuts, bruises, or swelling, ask: "How large is it?" (e.g., inches or centimeters;  entire joint)      denies 7. PAIN: "Is there pain?" If Yes, ask: "How bad is the pain?"    (e.g., Scale 1-10; or mild, moderate, severe)   - NONE (0): no pain.   - MILD (1-3): doesn't interfere with normal activities.    - MODERATE (4-7): interferes with normal activities (e.g., work or school) or awakens from sleep, limping.    - SEVERE (8-10): excruciating pain, unable to do any normal activities, unable to walk.      11/10 8. TETANUS: For any breaks in the skin, ask: "When was the last tetanus booster?"     no 9. OTHER SYMPTOMS: "Do you have any  other symptoms?"      no 10. PREGNANCY: "Is there any chance you are pregnant?" "When was your last menstrual period?"       no  Protocols used: Ankle and Foot Injury-A-AH

## 2024-01-22 ENCOUNTER — Encounter: Payer: Self-pay | Admitting: Family Medicine

## 2024-01-22 DIAGNOSIS — E039 Hypothyroidism, unspecified: Secondary | ICD-10-CM

## 2024-02-01 ENCOUNTER — Other Ambulatory Visit (HOSPITAL_COMMUNITY): Payer: Self-pay

## 2024-02-01 ENCOUNTER — Telehealth: Payer: Self-pay | Admitting: Pharmacist

## 2024-02-01 ENCOUNTER — Other Ambulatory Visit

## 2024-02-01 DIAGNOSIS — E039 Hypothyroidism, unspecified: Secondary | ICD-10-CM

## 2024-02-01 NOTE — Telephone Encounter (Signed)
 Pharmacy Patient Advocate Encounter   Received notification from CoverMyMeds that prior authorization for Nurtec is required/requested.   Insurance verification completed.   The patient is insured through [employer] .   Per test claim: The current 30 day co-pay is, $0.00.  No PA needed at this time. This test claim was processed through [facility]- copay amounts may vary at other pharmacies due to pharmacy/plan contracts, or as the patient moves through the different stages of their insurance plan.

## 2024-02-02 ENCOUNTER — Other Ambulatory Visit: Payer: Self-pay | Admitting: Family Medicine

## 2024-02-02 ENCOUNTER — Ambulatory Visit: Payer: Self-pay | Admitting: Family Medicine

## 2024-02-02 DIAGNOSIS — E039 Hypothyroidism, unspecified: Secondary | ICD-10-CM

## 2024-02-02 LAB — T4, FREE: Free T4: 1.1 ng/dL (ref 0.82–1.77)

## 2024-02-02 LAB — TSH: TSH: 5.2 u[IU]/mL — ABNORMAL HIGH (ref 0.450–4.500)

## 2024-02-02 MED ORDER — LEVOTHYROXINE SODIUM 88 MCG PO TABS: 88.0000 ug | ORAL_TABLET | Freq: Every day | ORAL | 3 refills | Status: AC

## 2024-02-22 ENCOUNTER — Ambulatory Visit: Admitting: Family Medicine

## 2024-03-01 ENCOUNTER — Encounter: Payer: Self-pay | Admitting: Family Medicine

## 2024-03-01 ENCOUNTER — Ambulatory Visit (INDEPENDENT_AMBULATORY_CARE_PROVIDER_SITE_OTHER): Admitting: Family Medicine

## 2024-03-01 VITALS — BP 107/68 | HR 82 | Temp 97.0°F | Ht 65.0 in | Wt 217.0 lb

## 2024-03-01 DIAGNOSIS — F419 Anxiety disorder, unspecified: Secondary | ICD-10-CM

## 2024-03-01 DIAGNOSIS — F339 Major depressive disorder, recurrent, unspecified: Secondary | ICD-10-CM | POA: Diagnosis not present

## 2024-03-01 DIAGNOSIS — R45851 Suicidal ideations: Secondary | ICD-10-CM

## 2024-03-01 DIAGNOSIS — I4711 Inappropriate sinus tachycardia, so stated: Secondary | ICD-10-CM

## 2024-03-01 DIAGNOSIS — K219 Gastro-esophageal reflux disease without esophagitis: Secondary | ICD-10-CM

## 2024-03-01 MED ORDER — SERTRALINE HCL 50 MG PO TABS: 50.0000 mg | ORAL_TABLET | Freq: Every day | ORAL | 5 refills | Status: DC

## 2024-03-01 MED ORDER — FLUOXETINE HCL 20 MG PO CAPS: 20.0000 mg | ORAL_CAPSULE | Freq: Every day | ORAL | 1 refills | Status: DC

## 2024-03-01 NOTE — Progress Notes (Signed)
 Established Patient Office Visit  Subjective   Patient ID: Claire Stewart, female    DOB: [birth year]  Age: 23 y.o. MRN: [id number]  Chief Complaint  Patient presents with   Medical Management of Chronic Issues    HPI Claire Stewart is here with her mother today to discuss depression and anxiety. She has noticed symptoms for about 6 months. This has been increasing over the last 3 months. She has noticed decreased interest, desire to sleep all day, irritability, anger outburts, worries about a lot of different things, difficulty controlling her worry. She started family counseling last week and will have individual therapy starting next week. She did have a ferret that she was close too that recently died. This was an emotional support for her. She denies plan or intent to harm herself. She does endorse thoughts that she would be better off dead sometimes. She is interested in trying medication to help manage her symptoms. She has never tried medication before. She has started some CBD gummies. Thinks this may have helped some. Mother is hesitant to have Claire Stewart start medication but will support her if this is what she wishes to do. She knows her daughter needs help.     03/01/2024   10:47 AM 11/13/2023    9:10 AM 05/29/2023    3:13 PM  Depression screen PHQ 2/9  Decreased Interest 3 3 2   Down, Depressed, Hopeless 2 3 3   PHQ - 2 Score 5 6 5   Altered sleeping 2 2 2   Tired, decreased energy 2 3 2   Change in appetite 3 0 3  Feeling bad or failure about yourself  3 2 3   Trouble concentrating 0 2 3  Moving slowly or fidgety/restless 0 0 0  Suicidal thoughts 3 2 0  PHQ-9 Score 18 17 18   Difficult doing work/chores Very difficult  Very difficult      03/01/2024   10:48 AM 11/13/2023    9:11 AM 05/29/2023    3:12 PM 11/24/2022    4:01 PM  GAD 7 : Generalized Anxiety Score  Nervous, Anxious, on Edge 3 1 2  0  Control/stop worrying 2 2 1  0  Worry too much - different things 3 2 2  0  Trouble relaxing 3 2  2  0  Restless 1 1 3  0  Easily annoyed or irritable 3 3 3  0  Afraid - awful might happen 2 3 3  0  Total GAD 7 Score 17 14 16  0  Anxiety Difficulty Very difficult  Very difficult Not difficult at all       ROS As per HPI.    Objective:     BP 107/68   Pulse 82   Temp (!) 97 F (36.1 C) (Temporal)   Ht 5' 5 (1.651 m)   Wt 217 lb (98.4 kg)   SpO2 95%   BMI 36.11 kg/m    Physical Exam Vitals and nursing note reviewed.  Constitutional:      General: She is not in acute distress.    Appearance: She is not ill-appearing, toxic-appearing or diaphoretic.   Cardiovascular:     Rate and Rhythm: Normal rate and regular rhythm.     Heart sounds: Normal heart sounds. No murmur heard. Pulmonary:     Effort: Pulmonary effort is normal. No respiratory distress.     Breath sounds: Normal breath sounds. No wheezing, rhonchi or rales.   Skin:    General: Skin is warm and dry.   Neurological:     Mental  Status: She is alert and oriented to person, place, and time. Mental status is at baseline.   Psychiatric:        Mood and Affect: Mood normal.        Behavior: Behavior normal.      No results found for any visits on 03/01/24.    The ASCVD Risk score (Arnett DK, et al., 2019) failed to calculate for the following reasons:   The 2019 ASCVD risk score is only valid for ages 4 to 21    Assessment & Plan:   Claire Stewart was seen today for medical management of chronic issues.  Diagnoses and all orders for this visit:  Depression, recurrent (HCC) Passive suicidal ideations Anxiety Uncontrolled. She is able to verbally contract for safety today. Aware of black box warning of increase SI. Mom will closely monitor. Will schedule follow up in 6 weeks, sooner for new or worsening symptoms. -     FLUoxetine (PROZAC) 20 MG capsule; Take 1 capsule (20 mg total) by mouth daily.  Gastroesophageal reflux disease, unspecified whether esophagitis present On PPI.   Inappropriate sinus  node tachycardia (HCC) Discussed that prozac may increase the effectiveness of metoprolol . She wears a smart watch and will monitor her HR.     Return in about 6 weeks (around 04/12/2024) for medication follow up.   The patient indicates understanding of these issues and agrees with the plan.  Albertha Huger, FNP

## 2024-03-12 ENCOUNTER — Telehealth: Payer: Self-pay | Admitting: Neurology

## 2024-03-12 NOTE — Telephone Encounter (Signed)
 LVM and sent mychart msg informing pt of need to reschedule 07/04/24 appt - MD out

## 2024-03-15 ENCOUNTER — Encounter: Payer: Self-pay | Admitting: Family Medicine

## 2024-03-18 ENCOUNTER — Telehealth: Payer: Self-pay | Admitting: Neurology

## 2024-03-18 ENCOUNTER — Other Ambulatory Visit

## 2024-03-18 DIAGNOSIS — E039 Hypothyroidism, unspecified: Secondary | ICD-10-CM

## 2024-03-18 NOTE — Telephone Encounter (Signed)
 Request to reschedule appointment that was cx due to provider being on vacation

## 2024-03-19 ENCOUNTER — Ambulatory Visit: Payer: Self-pay | Admitting: Family Medicine

## 2024-03-19 LAB — TSH: TSH: 3.1 u[IU]/mL (ref 0.450–4.500)

## 2024-04-15 ENCOUNTER — Encounter: Payer: Self-pay | Admitting: Family Medicine

## 2024-04-15 ENCOUNTER — Ambulatory Visit (INDEPENDENT_AMBULATORY_CARE_PROVIDER_SITE_OTHER): Admitting: Family Medicine

## 2024-04-15 VITALS — BP 101/65 | HR 84 | Temp 98.3°F | Ht 65.0 in | Wt 218.0 lb

## 2024-04-15 DIAGNOSIS — I4711 Inappropriate sinus tachycardia, so stated: Secondary | ICD-10-CM

## 2024-04-15 DIAGNOSIS — F339 Major depressive disorder, recurrent, unspecified: Secondary | ICD-10-CM

## 2024-04-15 DIAGNOSIS — F419 Anxiety disorder, unspecified: Secondary | ICD-10-CM

## 2024-04-15 MED ORDER — FLUOXETINE HCL 40 MG PO CAPS: 40.0000 mg | ORAL_CAPSULE | Freq: Every day | ORAL | 3 refills | Status: AC

## 2024-04-15 NOTE — Progress Notes (Signed)
 Established Patient Office Visit  Subjective   Patient ID: Claire Stewart, female    DOB: 18-Sep-2000  Age: 23 y.o. MRN: [id number]  Chief Complaint  Patient presents with   Medical Management of Chronic Issues    HPI Here with mother for medication follow up after staring prozac  6 weeks ago. She denies side effects. She reports that she does feel better and is having less symptoms. She is no longer having SI. She is still lashing out regularly. She has also started counseling and has found this beneficial. HR has been 60-70s.      04/15/2024   10:43 AM 03/01/2024   10:47 AM 11/13/2023    9:10 AM  Depression screen PHQ 2/9  Decreased Interest 1 3 3   Down, Depressed, Hopeless 0 2 3  PHQ - 2 Score 1 5 6   Altered sleeping 3 2 2   Tired, decreased energy 3 2 3   Change in appetite 1 3 0  Feeling bad or failure about yourself  1 3 2   Trouble concentrating 2 0 2  Moving slowly or fidgety/restless 0 0 0  Suicidal thoughts 0 3 2  PHQ-9 Score 11 18 17   Difficult doing work/chores Very difficult Very difficult       04/15/2024   10:43 AM 03/01/2024   10:48 AM 11/13/2023    9:11 AM 05/29/2023    3:12 PM  GAD 7 : Generalized Anxiety Score  Nervous, Anxious, on Edge 1 3 1 2   Control/stop worrying 1 2 2 1   Worry too much - different things 2 3 2 2   Trouble relaxing 1 3 2 2   Restless 1 1 1 3   Easily annoyed or irritable 2 3 3 3   Afraid - awful might happen 2 2 3 3   Total GAD 7 Score 10 17 14 16   Anxiety Difficulty Somewhat difficult Very difficult  Very difficult       ROS As per HPI.    Objective:     BP 101/65   Pulse 84   Temp 98.3 F (36.8 C) (Temporal)   Ht 5' 5 (1.651 m)   Wt 218 lb (98.9 kg)   SpO2 96%   BMI 36.28 kg/m    Physical Exam Vitals and nursing note reviewed.  Constitutional:      General: She is not in acute distress.    Appearance: Normal appearance. She is not ill-appearing.  Cardiovascular:     Rate and Rhythm: Normal rate and regular rhythm.      Pulses: Normal pulses.     Heart sounds: Normal heart sounds. No murmur heard. Pulmonary:     Effort: Pulmonary effort is normal. No respiratory distress.     Breath sounds: Normal breath sounds.  Musculoskeletal:     Right lower leg: No edema.     Left lower leg: No edema.  Skin:    General: Skin is warm and dry.  Neurological:     General: No focal deficit present.     Mental Status: She is alert and oriented to person, place, and time.  Psychiatric:        Mood and Affect: Mood normal.        Behavior: Behavior normal.      No results found for any visits on 04/15/24.    The ASCVD Risk score (Arnett DK, et al., 2019) failed to calculate for the following reasons:   The 2019 ASCVD risk score is only valid for ages 34 to 39  Assessment & Plan:   Depression, recurrent (HCC) Anxiety Improving but still not well controlled. Denies SI. Increase prozac  to 40 mg daily. Continue counseling. Follow up in 6 weeks, sooner for new or worsening symptoms.  -     FLUoxetine  HCl; Take 1 capsule (40 mg total) by mouth daily.  Dispense: 90 capsule; Refill: 3  Inappropriate sinus node tachycardia (HCC) Continue to monitor HR as prozac  may increase effectiveness of metoprolol .     The patient indicates understanding of these issues and agrees with the plan.  Annabella CHRISTELLA Search, FNP

## 2024-05-09 ENCOUNTER — Other Ambulatory Visit: Payer: Self-pay

## 2024-05-09 MED ORDER — METOPROLOL TARTRATE 25 MG PO TABS
25.0000 mg | ORAL_TABLET | Freq: Two times a day (BID) | ORAL | 0 refills | Status: AC
Start: 2024-05-09 — End: ?

## 2024-05-27 ENCOUNTER — Telehealth (INDEPENDENT_AMBULATORY_CARE_PROVIDER_SITE_OTHER): Admitting: Family Medicine

## 2024-05-27 DIAGNOSIS — F322 Major depressive disorder, single episode, severe without psychotic features: Secondary | ICD-10-CM

## 2024-05-27 DIAGNOSIS — F419 Anxiety disorder, unspecified: Secondary | ICD-10-CM | POA: Diagnosis not present

## 2024-05-27 NOTE — Progress Notes (Signed)
   Virtual Visit via video Note   Due to COVID-19 pandemic this visit was conducted virtually. This visit type was conducted due to national recommendations for restrictions regarding the COVID-19 Pandemic (e.g. social distancing, sheltering in place) in an effort to limit this patient's exposure and mitigate transmission in our community. All issues noted in this document were discussed and addressed.  A physical exam was not performed with this format.  I connected with  Claire Stewart  on 05/27/24 at 1449 by video and verified that I am speaking with the correct person using two identifiers. Claire D. Harcum is currently located in [place] in the car and her mother and brother are currently with her during the visit. The provider, Annabella CHRISTELLA Search, FNP is located in their office at time of visit.  I discussed the limitations, risks, security and privacy concerns of performing an evaluation and management service by video  and the availability of in person appointments. I also discussed with the patient that there may be a patient responsible charge related to this service. The patient expressed understanding and agreed to proceed.  CC: anxiety, depression  History and Present Illness:  History of Present Illness   Claire Stewart is a 23 year old female who presents for follow-up after an increase in fluoxetine  dosage.  Mood disturbance - Improved mood with less anger and irritability - Decreased sadness - No thoughts of self-harm  Anxiety symptoms - Decreased anxiety and worry - Nervousness persists but is less frequent  Medication tolerance - No side effects from increased fluoxetine  dosage      ROS As per HPI.    Observations/Objective: Alert and oriented. Respirations unlabored. No cyanosis. Non toxic appearing. Normal mood and behavior.    Assessment and Plan: [name] was seen today for anxiety.  Diagnoses and all orders for this visit:  Depression, major, single episode,  severe (HCC)  Anxiety      Major depressive disorder, anxiety Symptoms improved with fluoxetine  40 mg. No side effects or self-harm thoughts. Current dosage effective. - Continue fluoxetine  40 mg daily.        Follow Up Instructions: Schedule labs appointment for TSH next week.     I discussed the assessment and treatment plan with the patient. The patient was provided an opportunity to ask questions and all were answered. The patient agreed with the plan and demonstrated an understanding of the instructions.   The patient was advised to call back or seek an in-person evaluation if the symptoms worsen or if the condition fails to improve as anticipated.  The above assessment and management plan was discussed with the patient. The patient verbalized understanding of and has agreed to the management plan. Patient is aware to call the clinic if symptoms persist or worsen. Patient is aware when to return to the clinic for a follow-up visit. Patient educated on when it is appropriate to go to the emergency department.   Time call ended:1456  I provided 7 minutes of face-to-face time during this encounter.    Annabella CHRISTELLA Search, FNP

## 2024-05-30 ENCOUNTER — Telehealth: Payer: Self-pay | Admitting: Family Medicine

## 2024-05-30 NOTE — Telephone Encounter (Signed)
 Patient has appt 9-22 for labs and orders needs to be put in. Per mother patient had a video 9-16 and Tiffany told patient to come and do labs.

## 2024-05-31 ENCOUNTER — Other Ambulatory Visit: Payer: Self-pay | Admitting: Family Medicine

## 2024-05-31 DIAGNOSIS — E039 Hypothyroidism, unspecified: Secondary | ICD-10-CM

## 2024-05-31 NOTE — Telephone Encounter (Signed)
 Order placed

## 2024-06-03 ENCOUNTER — Other Ambulatory Visit

## 2024-06-03 DIAGNOSIS — E039 Hypothyroidism, unspecified: Secondary | ICD-10-CM

## 2024-06-04 LAB — TSH: TSH: 1.37 u[IU]/mL (ref 0.450–4.500)

## 2024-06-05 ENCOUNTER — Ambulatory Visit: Payer: Self-pay | Admitting: Family Medicine

## 2024-06-08 ENCOUNTER — Emergency Department (HOSPITAL_COMMUNITY)
Admission: EM | Admit: 2024-06-08 | Discharge: 2024-06-08 | Disposition: A | Discharge status: Home / Self Care | Attending: Emergency Medicine | Admitting: Emergency Medicine

## 2024-06-08 ENCOUNTER — Encounter (HOSPITAL_COMMUNITY): Payer: Self-pay

## 2024-06-08 ENCOUNTER — Other Ambulatory Visit: Payer: Self-pay

## 2024-06-08 ENCOUNTER — Emergency Department (HOSPITAL_COMMUNITY)

## 2024-06-08 DIAGNOSIS — R519 Headache, unspecified: Secondary | ICD-10-CM | POA: Diagnosis present

## 2024-06-08 LAB — BASIC METABOLIC PANEL WITH GFR
Anion gap: 13 (ref 5–15)
BUN: 13 mg/dL (ref 6–20)
CO2: 23 mmol/L (ref 22–32)
Calcium: 9.7 mg/dL (ref 8.9–10.3)
Chloride: 103 mmol/L (ref 98–111)
Creatinine, Ser: 0.85 mg/dL (ref 0.44–1.00)
GFR, Estimated: >60 mL/min (ref >60)
Glucose, Bld: 85 mg/dL (ref 70–99)
Potassium: 3.5 mmol/L (ref 3.5–5.1)
Sodium: 139 mmol/L (ref 135–145)

## 2024-06-08 LAB — CBC WITH DIFFERENTIAL/PLATELET
Abs Immature Granulocytes: 0.03 K/uL (ref 0.00–0.07)
Basophils Absolute: 0.1 K/uL (ref 0.0–0.1)
Basophils Relative: 1 %
Eosinophils Absolute: 0.1 K/uL (ref 0.0–0.5)
Eosinophils Relative: 1 %
HCT: 45.9 % (ref 36.0–46.0)
Hemoglobin: 14.8 g/dL (ref 12.0–15.0)
Immature Granulocytes: 0 %
Lymphocytes Relative: 29 %
Lymphs Abs: 2.7 K/uL (ref 0.7–4.0)
MCH: 30.5 pg (ref 26.0–34.0)
MCHC: 32.2 g/dL (ref 30.0–36.0)
MCV: 94.4 fL (ref 80.0–100.0)
Monocytes Absolute: 0.6 K/uL (ref 0.1–1.0)
Monocytes Relative: 7 %
Neutro Abs: 5.6 K/uL (ref 1.7–7.7)
Neutrophils Relative %: 62 %
Platelets: 322 K/uL (ref 150–400)
RBC: 4.86 MIL/uL (ref 3.87–5.11)
RDW: 11.4 % — ABNORMAL LOW (ref 11.5–15.5)
WBC: 9.1 K/uL (ref 4.0–10.5)
nRBC: 0 % (ref 0.0–0.2)

## 2024-06-08 MED ORDER — PROCHLORPERAZINE EDISYLATE 10 MG/2ML IJ SOLN
10.0000 mg | Freq: Once | INTRAMUSCULAR | Status: AC
  Administered 2024-06-08: 10 mg via INTRAVENOUS
  Filled 2024-06-08: qty 2

## 2024-06-08 MED ORDER — SODIUM CHLORIDE 0.9 % IV BOLUS
500.0000 mL | Freq: Once | INTRAVENOUS | Status: AC
  Administered 2024-06-08: 500 mL via INTRAVENOUS

## 2024-06-08 MED ORDER — KETOROLAC TROMETHAMINE 30 MG/ML IJ SOLN
15.0000 mg | Freq: Once | INTRAMUSCULAR | Status: AC
  Administered 2024-06-08: 15 mg via INTRAVENOUS
  Filled 2024-06-08: qty 1

## 2024-06-08 MED ORDER — DIPHENHYDRAMINE HCL 50 MG/ML IJ SOLN
25.0000 mg | Freq: Once | INTRAMUSCULAR | Status: AC
  Administered 2024-06-08: 25 mg via INTRAVENOUS
  Filled 2024-06-08: qty 1

## 2024-06-08 NOTE — Discharge Instructions (Signed)
 As discussed, call your neurologist on Monday to arrange follow-up appointment.  Return to the emergency department for any new or worsening symptoms.

## 2024-06-08 NOTE — ED Notes (Signed)
 Pt/family received d/c paperwork at this time. After going over the paperwork any questions, comments, or concerns were answered to the best of this nurse's knowledge. The pt/family verbally acknowledged the teachings/instructions.   D/c paperwork given and reviewed with mother.

## 2024-06-08 NOTE — ED Provider Notes (Signed)
 [place] EMERGENCY DEPARTMENT AT [facility] Provider Note   CSN: [id number] Arrival date & time: 06/08/24  8096     Patient presents with: Headache   Claire Stewart is a 23 y.o. female.    Headache Associated symptoms: no congestion, no dizziness, no eye pain, no fever, no nausea, no neck pain, no neck stiffness, no numbness, no sore throat, no vomiting and no weakness        Claire Stewart is a 23 y.o. female with past medical history of brain injury, seizures, ADHD, migraine headaches who presents to the Emergency Department for evaluation of headache.  States headache has been persistent for 1 month.  She has been taking her migraine medications without relief.  She describes a pressure or vice like sensation to the top of her head.  She denies any fever, chills, visual changes, numbness or weakness, vomiting, neck pain or stiffness.  She does follow-up with neurology on a regular basis.  She also denies any new medications or missed doses  Prior to Admission medications   Medication Sig Start Date End Date Taking? Authorizing Provider  cyclobenzaprine  (FEXMID ) 7.5 MG tablet Take 1 tablet (7.5 mg total) by mouth 3 (three) times daily as needed for muscle spasms. 05/29/23   Joesph Annabella HERO, FNP  diltiazem  (CARDIZEM ) 30 MG tablet Take 1 tablet (30 mg total) by mouth every 8 (eight) hours as needed. 12/14/22   Mallipeddi, Vishnu P, MD  fexofenadine (ALLEGRA) 180 MG tablet Take 180 mg by mouth in the morning and at bedtime.    [provider]  FIBER PO Take 1 tablet by mouth daily.    [provider]  FLUoxetine  (PROZAC ) 40 MG capsule Take 1 capsule (40 mg total) by mouth daily. 04/15/24   Joesph Annabella HERO, FNP  levETIRAcetam  (KEPPRA  XR) 750 MG 24 hr tablet Take 4 tablets (3,000 mg total) by mouth daily. Take 4 tablets at night 07/05/23   Gayland Lauraine PARAS, NP  levothyroxine  (SYNTHROID ) 88 MCG tablet Take 1 tablet (88 mcg total) by mouth daily. 02/02/24    Joesph Annabella HERO, FNP  magnesium gluconate (MAGONATE) 500 MG tablet Take 500 mg by mouth daily.    [provider]  meloxicam  (MOBIC ) 7.5 MG tablet Take 1 tablet (7.5 mg total) by mouth daily as needed. for pain 07/05/23   Gayland Lauraine PARAS, NP  metoprolol  tartrate (LOPRESSOR ) 25 MG tablet Take 1 tablet (25 mg total) by mouth 2 (two) times daily. 05/09/24   Mallipeddi, Vishnu P, MD  montelukast  (SINGULAIR ) 10 MG tablet Take 1 tablet (10 mg total) by mouth at bedtime. 09/15/23   Joesph Annabella HERO, FNP  naproxen  (NAPROSYN ) 500 MG tablet TAKE 1 TABLET BY MOUTH TWICE A DAY WITH FOOD 05/04/23   Joesph Annabella HERO, FNP  norethindrone -ethinyl estradiol -FE (JUNEL FE 1/20) 1-20 MG-MCG tablet Take 1 tablet by mouth daily. Take continuously. 10/25/23   Joesph Annabella HERO, FNP  NURTEC 75 MG TBDP TAKE 75 MG BY MOUTH AS NEEDED (TAKE 1 TABLET AT ONSET OF HEADACHE). 05/04/23   Gayland Lauraine PARAS, NP  omeprazole  (PRILOSEC) 20 MG capsule Take 1 capsule (20 mg total) by mouth daily. 08/17/23   Zollie Lowers, MD  OVER THE COUNTER MEDICATION Gummy multi vit    [provider]  OVER THE COUNTER MEDICATION Gummy digestive advantage    [provider]  PRESCRIPTION MEDICATION Dialstat    [provider]  prochlorperazine  (COMPAZINE ) 10 MG tablet Take 1 tablet (10  mg total) by mouth every 8 (eight) hours as needed for nausea or vomiting. 07/05/23   Gayland Lauraine PARAS, NP  Riboflavin (VITAMIN B-2 PO) Take 1 tablet by mouth every 7 (seven) days.    [provider]  rizatriptan  (MAXALT -MLT) 10 MG disintegrating tablet Take 1 tablet (10 mg total) by mouth as needed. May repeat in 2 hours if needed 07/05/23   Gayland Lauraine PARAS, NP    Allergies: Sulfa antibiotics and Levetiracetam     Review of Systems  Constitutional:  Negative for chills and fever.  HENT:  Negative for congestion and sore throat.   Eyes:  Negative for pain and visual disturbance.  Respiratory:  Negative for shortness of breath.    Cardiovascular:  Negative for chest pain.  Gastrointestinal:  Negative for nausea and vomiting.  Musculoskeletal:  Negative for neck pain and neck stiffness.  Neurological:  Positive for headaches. Negative for dizziness, syncope, weakness and numbness.  Psychiatric/Behavioral:  Negative for confusion.     Updated Vital Signs BP 128/77 (BP Location: Right Arm)   Pulse 82   Temp (!) 97.5 F (36.4 C) (Oral)   Resp 18   Ht 5' 4 (1.626 m)   Wt 99.8 kg   SpO2 97%   BMI 37.76 kg/m   Physical Exam Vitals and nursing note reviewed.  Constitutional:      Appearance: She is well-developed.  HENT:     Head: Atraumatic.  Eyes:     Extraocular Movements: Extraocular movements intact.     Conjunctiva/sclera: Conjunctivae normal.     Pupils: Pupils are equal, round, and reactive to light.  Neck:     Meningeal: Kernig's sign absent.  Cardiovascular:     Rate and Rhythm: Normal rate and regular rhythm.     Pulses: Normal pulses.  Pulmonary:     Effort: Pulmonary effort is normal.  Abdominal:     Palpations: Abdomen is soft.     Tenderness: There is no abdominal tenderness.  Musculoskeletal:        General: Normal range of motion.     Cervical back: Full passive range of motion without pain and normal range of motion. No rigidity.  Skin:    General: Skin is warm.     Capillary Refill: Capillary refill takes less than 2 seconds.  Neurological:     Mental Status: She is alert.     Sensory: No sensory deficit.     Motor: No weakness.     (all labs ordered are listed, but only abnormal results are displayed) Labs Reviewed - No data to display  EKG: None  Radiology: CT Head Wo Contrast Result Date: 06/08/2024 CLINICAL DATA:  Headache for 1 month. EXAM: CT HEAD WITHOUT CONTRAST TECHNIQUE: Contiguous axial images were obtained from the base of the skull through the vertex without intravenous contrast. RADIATION DOSE REDUCTION: This exam was performed according to the  departmental dose-optimization program which includes automated exposure control, adjustment of the mA and/or kV according to patient size and/or use of iterative reconstruction technique. COMPARISON:  None Available. FINDINGS: Brain: There is no evidence of acute infarction, hemorrhage, hydrocephalus, extra-axial collection or mass lesion/mass effect. There are areas of white matter decreased attenuation within the bilateral occipital lobes. Vascular: No hyperdense vessel or unexpected calcification. Skull: Normal. Negative for fracture or focal lesion. Sinuses/Orbits: There is mild right maxillary sinus mucosal thickening. Other: It should be noted that evaluation of the posterior fossa and skull base is limited secondary to patient motion. IMPRESSION: 1.  No acute intracranial abnormality. 2. 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 MRI correlation is recommended. 3. Mild right maxillary sinus disease. Electronically Signed   By: Suzen Dials M.D.   On: 06/08/2024 21:29     Procedures   Medications Ordered in the ED - No data to display                                  Medical Decision Making Patient here with history of brain injury and seizures.  Reports persistent headache of gradual onset x 1 month.  Endorses history of migraine headaches, but no relief with her current antimigraine medication.  Denies any new associated symptoms.  No recent injury neck pain or stiffness.  No reported recent seizure    Differential would include but not limited to atypical migraine, meningitis, subarachnoid hemorrhage, brain mass tension headache, all considered  Amount and/or Complexity of Data Reviewed Labs: ordered.    Details: Labs reassuring. Radiology: ordered.    Details: CT head showing no acute intracranial abnormality.  There is areas of decreased  attenuation in the bilateral occipital lobes likely sequela of prior encephalopathic syndrome. Discussion of management or test interpretation with external provider(s): Patient reports feeling better after IV fluids and medications here .  She is requesting to be discharged home.  Reports headache has mostly resolved.  I have discussed CT findings with patient and mother at bedside.  She does have neurology follow-up and agrees to arrange for follow-up on Monday.  Strict return precautions were also given.  Risk Prescription drug management.        Final diagnoses:  Nonintractable headache, unspecified chronicity pattern, unspecified headache type    ED Discharge Orders     None          Herlinda Milling, PA-C 06/10/24 1316    Francesca Elsie CROME, MD 06/20/24 1550

## 2024-06-08 NOTE — ED Triage Notes (Signed)
 Pt here due to headache that has been going on for a month. Pt stated that her normal migraine medications haven't helped it at all.

## 2024-06-10 ENCOUNTER — Encounter: Payer: Self-pay | Admitting: Neurology

## 2024-06-11 ENCOUNTER — Telehealth: Payer: Self-pay | Admitting: Neurology

## 2024-06-11 ENCOUNTER — Telehealth (INDEPENDENT_AMBULATORY_CARE_PROVIDER_SITE_OTHER): Admitting: Neurology

## 2024-06-11 DIAGNOSIS — G40209 Localization-related (focal) (partial) symptomatic epilepsy and epileptic syndromes with complex partial seizures, not intractable, without status epilepticus: Secondary | ICD-10-CM | POA: Diagnosis not present

## 2024-06-11 DIAGNOSIS — G43709 Chronic migraine without aura, not intractable, without status migrainosus: Secondary | ICD-10-CM | POA: Diagnosis not present

## 2024-06-11 MED ORDER — AIMOVIG 70 MG/ML ~~LOC~~ SOAJ: 70.0000 mg | SUBCUTANEOUS | 11 refills | Status: DC

## 2024-06-11 NOTE — Telephone Encounter (Signed)
 Cancel her follow up with me in Oct, reschedule with NP in 6 months

## 2024-06-11 NOTE — Patient Instructions (Addendum)
 Nurtec/Maxalt  as needed for migraine  May combine Compazine  2.  Aleve    As needed for severe migraine.  Meds ordered this encounter  Medications   Erenumab-aooe (AIMOVIG) 70 MG/ML SOAJ- as migraine prevention    Sig: Inject 70 mg into the skin every 30 (thirty) days.    Dispense:  1.12 mL    Refill:  11

## 2024-06-11 NOTE — Progress Notes (Signed)
 ASSESSMENT AND PLAN 23 y.o. year old female   1.  Neonatal seizure, intracranial hemorrhage 2.  Complex partial seizure with secondary generalization 3.  Chronic migraine headaches  Keep Keppra  XR 715 4 tablets every night as preventive medication  Add aimovig 70 mg monthly as migraine prevention  Maxalt  or Nurtec as needed, may combine with Compazine , NSAIDs for prolonged severe headaches  Report transient confusion spells, EEG   HISTORY  Claire Stewart is a 23 year old female, seen in request by her primary care nurse practitioner Merlynn Eland evaluation of seizure, learning disability, migraine headache, she is accompanied by her mother at today's clinical visit on February 27, 2020   I reviewed and summarized the referring note. She moved from [place]  to [place]  recently, was previously under the care of neurologist for seizure, migraine headaches   She was born full-term, but suffered jaundice, at 82 days old, she suffered prolonged seizure, later noted developmental delay, was found to have significant visual impairment, she has been homeschooled,   She was treated with antiepileptic medication as an infant following her initial prolonged seizure, but medicine was tapered off,   By 23 years old, she was noted to have frequent staring off spells, eventually was diagnosed with partial seizure, has been treated with Keppra  titrating dose, currently taking extended release 750 mg 4 tablets at nighttime, works well for her, last seizure was on May 12, 2015, she had a sudden onset of staring spells,   She has a long history of migraine headaches, now having 1-3 times each month, taking Topamax  50/100 mg as preventive medication, Imitrex  works most of the time, but sometimes her headache is so severe, with significant light noise sensitivity, Imitrex  would not work for those prolonged severe migraine headaches   She is legally blind,can read large print only,   UPDATE Aug 31 2020: She is accompanied by her mother at today's clinical visit, She had no recurrent seizure, tolerating Keppra  xr 750 mg 4 tablets every night   She continue has frequent migraine headaches, often preceded by blurry vision, about once a week, as to be absent from her home schooling, Maxalt  dissolvable only provide limited help, felt Imitrex  nasal spray works better for her in the past   We personally reviewed MRI of the brain with and without contrast in July 2021, bilateral occipital, parietal occipital cystic encephalomalacia, gliosis, no acute abnormality   EEG was normal in July 2021   UPDATE April 07 2021: She still does home schooling, overall doing very well, like Keppra  XR 500 mg 4 tablets every night, had no recurrent seizure for few years, she is dealing with endometriosis, has frequent lower abdominal pain, she also has occasionally migraine, couple times each month, Imitrex  works well, but she preferred to go back Maxalt  dissolvable over Imitrex  subcutaneous injection, felt the benefit has not significant difference   Virtual Visit via video UPDATE Sept 30 2025 I discussed the limitations of evaluation and management by telemedicine and the availability of in person appointments. The patient expressed understanding and agreed to proceed  Location: Provider: [facility] office; Patient: Home  I connected with Claire Stewart  on Sept 30 2025 by a video enabled telemedicine application and verified that I am speaking with the correct person using two identifiers.  UPDATED HiSTORY She did not have clinical seizure, but reported occasional spells of mild confusion, neck turning towards the left side, without loss of consciousness, lasting 1 to 2 minutes, tolerating  Keppra  XR 750 mg 4 tablets every night  She complains of frequent headache, was treated at emergency room June 08, 2024 for prolonged severe headaches failed home remedy Maxalt  as needed  Had a repeat CT head, no acute  abnormality, bilateral occipital signal abnormality, consistent with previous MRI description  She report migraine February 10, March 11, much improved in August  She was on Topamax  for migraine prevention was not sure about the benefit, seems to cause increased symptoms from her endometriosis has stopped,   Observations/Objective: I have reviewed problem lists, medications, allergies. Awake, alert, oriented to history taking, casual conversation, facial symmetric, no dysarthria, no aphasia   REVIEW OF SYSTEMS: Out of a complete 14 system review of symptoms, the patient complains only of the following symptoms, and all other reviewed systems are negative.  See HPI  ALLERGIES: Allergies  Allergen Reactions   Sulfa Antibiotics Hives   Levetiracetam  Other (See Comments)    Can not have brand name- [employer]- caused nerve pain     HOME MEDICATIONS: Outpatient Medications Prior to Visit  Medication Sig Dispense Refill   cyclobenzaprine  (FEXMID ) 7.5 MG tablet Take 1 tablet (7.5 mg total) by mouth 3 (three) times daily as needed for muscle spasms. 90 tablet 11   diltiazem  (CARDIZEM ) 30 MG tablet Take 1 tablet (30 mg total) by mouth every 8 (eight) hours as needed. 30 tablet 0   fexofenadine (ALLEGRA) 180 MG tablet Take 180 mg by mouth in the morning and at bedtime.     FIBER PO Take 1 tablet by mouth daily.     FLUoxetine  (PROZAC ) 40 MG capsule Take 1 capsule (40 mg total) by mouth daily. 90 capsule 3   levETIRAcetam  (KEPPRA  XR) 750 MG 24 hr tablet Take 4 tablets (3,000 mg total) by mouth daily. Take 4 tablets at night 360 tablet 4   levothyroxine  (SYNTHROID ) 88 MCG tablet Take 1 tablet (88 mcg total) by mouth daily. 90 tablet 3   magnesium gluconate (MAGONATE) 500 MG tablet Take 500 mg by mouth daily.     meloxicam  (MOBIC ) 7.5 MG tablet Take 1 tablet (7.5 mg total) by mouth daily as needed. for pain 20 tablet 2   metoprolol  tartrate (LOPRESSOR ) 25 MG tablet Take 1 tablet (25 mg total)  by mouth 2 (two) times daily. 30 tablet 0   montelukast  (SINGULAIR ) 10 MG tablet Take 1 tablet (10 mg total) by mouth at bedtime. 90 tablet 3   naproxen  (NAPROSYN ) 500 MG tablet TAKE 1 TABLET BY MOUTH TWICE A DAY WITH FOOD 60 tablet 2   norethindrone -ethinyl estradiol -FE (JUNEL FE 1/20) 1-20 MG-MCG tablet Take 1 tablet by mouth daily. Take continuously. 90 tablet 3   NURTEC 75 MG TBDP TAKE 75 MG BY MOUTH AS NEEDED (TAKE 1 TABLET AT ONSET OF HEADACHE). 8 tablet 11   omeprazole  (PRILOSEC) 20 MG capsule Take 1 capsule (20 mg total) by mouth daily. 90 capsule 3   OVER THE COUNTER MEDICATION Gummy multi vit     OVER THE COUNTER MEDICATION Gummy digestive advantage     PRESCRIPTION MEDICATION Dialstat     prochlorperazine  (COMPAZINE ) 10 MG tablet Take 1 tablet (10 mg total) by mouth every 8 (eight) hours as needed for nausea or vomiting. 10 tablet 3   Riboflavin (VITAMIN B-2 PO) Take 1 tablet by mouth every 7 (seven) days.     rizatriptan  (MAXALT -MLT) 10 MG disintegrating tablet Take 1 tablet (10 mg total) by mouth as needed. May repeat in 2 hours  if needed 12 tablet 11   No facility-administered medications prior to visit.    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   Cortical visual impairment    Depression    Elevated TSH 05/26/2021   Epilepsy (HCC)    Learning disabilities    global language learning disability   Legally blind    Migraines    Mixed hyperlipidemia 05/26/2021   Sensory processing difficulty    Short-term memory loss    Vaginal Pap smear, abnormal    Vitamin D  deficiency 05/27/2022    PAST SURGICAL HISTORY: Past Surgical History:  Procedure Laterality Date   MOUTH SURGERY     WISDOM TOOTH EXTRACTION       FAMILY HISTORY: Family History  Problem Relation Age of Onset   Hyperlipidemia Mother    Allergies Mother    ADD / ADHD Mother    Depression Mother    Arthritis Mother    Alcohol abuse Father    Drug abuse Father    Depression Father    Thyroid  disease Father    Allergies Brother    ADD / ADHD Brother    Cancer Paternal Aunt    Drug abuse Maternal Grandmother    Diabetes Maternal Grandmother    Bipolar disorder Maternal Grandmother    Seizures Maternal Grandmother    Migraines Maternal Grandmother    Hypertension Maternal Grandfather    Hyperlipidemia Maternal Grandfather    Depression Maternal Grandfather    Arthritis Maternal Grandfather    Breast cancer Cousin     SOCIAL HISTORY: Social History   Socioeconomic History   Marital status: Single    Spouse name: Not on file   Number of children: 0   Years of education: high 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   Right-handed.   No daily use of caffeine.   Social Drivers of Corporate investment banker Strain: Low Risk  (05/27/2024)   Overall Financial Resource Strain (CARDIA)    Difficulty of Paying Living Expenses: Not very hard  Food Insecurity: Low Risk  (05/28/2024)   Received from [facility]   Hunger Vital Sign    Within the past 12 months, you worried that your food would run out before you got money to buy more: Never true    Within the past 12 months, the food you bought just didn't last and you didn't have money to get more. : Never true  Transportation Needs: No Transportation Needs (05/28/2024)   Received  from [facility]    In the past 12 months, has lack of reliable transportation kept you from medical appointments, meetings, work or from getting things needed for daily living? : No  Physical Activity: Insufficiently Active (  05/27/2024)   Exercise Vital Sign    Days of Exercise per Week: 6 days    Minutes of Exercise per Session: 10 min  Stress: Stress Concern Present (05/27/2024)   [employer] of Occupational Health - Occupational Stress Questionnaire    Feeling of Stress: To some extent  Social Connections: Unknown (05/27/2024)   Social Connection and Isolation Panel    Frequency of Communication with Friends and Family: More than three times a week    Frequency of Social Gatherings with Friends and Family: Once a week    Attends Religious Services: More than 4 times per year    Active Member of [employer] or Organizations: Yes    Attends Banker Meetings: More than 4 times per year    Marital Status: Patient declined  Intimate Partner Violence: Not At Risk (11/13/2023)   Humiliation, Afraid, Rape, and Kick questionnaire    Fear of Current or Ex-Partner: No    Emotionally Abused: No    Physically Abused: No    Sexually Abused: No   PHYSICAL EXAM  There were no vitals filed for this visit.  There is no height or weight on file to calculate BMI.  Generalized: Well developed, in no acute distress  Neurological examination  Mentation: Alert, most history is provided by her mother, patient has cognitive impairment, but is interactive and participatory during exam. Cranial nerve II-XII: Pupils were equal round reactive to light.  Poor vision. Facial sensation and strength were normal.  Head turning and shoulder shrug  were normal and symmetric. Motor: The motor testing reveals 5 over 5 strength of all 4 extremities. Good symmetric motor tone is noted throughout.  Sensory: Sensory testing is intact to soft touch on all 4 extremities. No evidence of  extinction is noted.  Coordination: Cerebellar testing reveals good finger-nose-finger and heel-to-shin bilaterally.  Gait and station: Gait is cautious, has a walking stick. Reflexes: Deep tendon reflexes are symmetric and normal bilaterally.   DIAGNOSTIC DATA (LABS, IMAGING, TESTING) - I reviewed patient records, labs, notes, testing and imaging myself where available.  Lab Results  Component Value Date   WBC 9.1 06/08/2024   HGB 14.8 06/08/2024   HCT 45.9 06/08/2024   MCV 94.4 06/08/2024   PLT 322 06/08/2024      Component Value Date/Time   NA 139 06/08/2024 2027   NA 136 06/01/2023 0906   K 3.5 06/08/2024 2027   CL 103 06/08/2024 2027   CO2 23 06/08/2024 2027   GLUCOSE 85 06/08/2024 2027   BUN 13 06/08/2024 2027   BUN 12 06/01/2023 0906   CREATININE 0.85 06/08/2024 2027   CALCIUM 9.7 06/08/2024 2027   PROT 7.0 06/01/2023 0906   ALBUMIN 4.0 06/01/2023 0906   AST 11 06/01/2023 0906   ALT 7 06/01/2023 0906   ALKPHOS 99 06/01/2023 0906   BILITOT 0.3 06/01/2023 0906   GFRNONAA >60 06/08/2024 2027   GFRAA 120 02/27/2020 0941   Lab Results  Component Value Date   CHOL 225 (H) 06/01/2023   HDL 49 06/01/2023   LDLCALC 114 (H) 06/01/2023   TRIG 357 (H) 06/01/2023   CHOLHDL 4.6 (H) 06/01/2023   Lab Results  Component Value Date   HGBA1C 4.2 (L) 06/01/2023   No results found for: [id number] Lab Results  Component Value Date   TSH 1.370 06/03/2024  Modena Callander. M.D. Ph.D.

## 2024-06-11 NOTE — Telephone Encounter (Signed)
 Pt called to scheduled  VV , There are no appointment available for PT to do VV before appt 10/28 , Informed PT  I WILL GIIVE pT A call back once I spoke to nurse

## 2024-06-24 ENCOUNTER — Other Ambulatory Visit: Payer: Self-pay | Admitting: Neurology

## 2024-06-24 ENCOUNTER — Telehealth: Payer: Self-pay | Admitting: Neurology

## 2024-06-24 ENCOUNTER — Other Ambulatory Visit: Payer: Self-pay | Admitting: Family Medicine

## 2024-06-24 DIAGNOSIS — M62838 Other muscle spasm: Secondary | ICD-10-CM

## 2024-06-24 DIAGNOSIS — J301 Allergic rhinitis due to pollen: Secondary | ICD-10-CM

## 2024-06-24 NOTE — Telephone Encounter (Signed)
 Pt's mother called requesting to schedule EEG, phone rep informed her there is no order in the system for the EEG.  Pt's mother is asking that Dr Onita has the order put in so pt can be scheduled for the needed EEG

## 2024-06-25 NOTE — Telephone Encounter (Signed)
 Spoke with patient and patient's mother and scheduled EEG for 07/09/24 at 8:15am

## 2024-07-04 ENCOUNTER — Ambulatory Visit: Payer: Managed Care, Other (non HMO) | Admitting: Neurology

## 2024-07-05 ENCOUNTER — Other Ambulatory Visit (HOSPITAL_COMMUNITY): Payer: Self-pay

## 2024-07-08 ENCOUNTER — Other Ambulatory Visit: Payer: Self-pay | Admitting: Neurology

## 2024-07-08 ENCOUNTER — Other Ambulatory Visit: Payer: Self-pay | Admitting: Family Medicine

## 2024-07-08 DIAGNOSIS — F419 Anxiety disorder, unspecified: Secondary | ICD-10-CM

## 2024-07-08 DIAGNOSIS — F339 Major depressive disorder, recurrent, unspecified: Secondary | ICD-10-CM

## 2024-07-08 DIAGNOSIS — J301 Allergic rhinitis due to pollen: Secondary | ICD-10-CM

## 2024-07-09 ENCOUNTER — Ambulatory Visit: Admitting: Neurology

## 2024-07-09 ENCOUNTER — Ambulatory Visit (INDEPENDENT_AMBULATORY_CARE_PROVIDER_SITE_OTHER): Admitting: Neurology

## 2024-07-09 DIAGNOSIS — G43709 Chronic migraine without aura, not intractable, without status migrainosus: Secondary | ICD-10-CM | POA: Diagnosis not present

## 2024-07-09 DIAGNOSIS — G40209 Localization-related (focal) (partial) symptomatic epilepsy and epileptic syndromes with complex partial seizures, not intractable, without status epilepticus: Secondary | ICD-10-CM

## 2024-07-10 ENCOUNTER — Other Ambulatory Visit (HOSPITAL_COMMUNITY): Payer: Self-pay

## 2024-07-25 NOTE — Procedures (Signed)
   HISTORY: 23 year old female with history of neonatal intracranial hemorrhage seizure  TECHNIQUE:  This is a routine 16 channel EEG recording with one channel devoted to a limited EKG recording.  It was performed during wakefulness, drowsiness and asleep.  Hyperventilation and photic stimulation were performed as activating procedures.  There are frequent bifrontal muscle artifact  Upon maximum arousal, posterior dominant waking rhythm consistent of mildly dysrhythmic theta range activity Activities are symmetric over the bilateral posterior derivations and attenuated with eye opening.  Photic stimulation did not alter the tracing.  Hyperventilation produced mild/moderate buildup with higher amplitude and the slower activities noted.  During EEG recording, patient developed drowsiness and no deeper stage of sleep was achieved  During EEG recording, there was no epileptiform discharge noted.  EKG demonstrate normal sinus rhythm.  CONCLUSION: This is mild abnormal EEG.  There is mild background slowing indicating mild bihemispheric malfunction, there is no electrodiagnostic evidence of epileptiform discharge.  Andee Chivers, M.D. Ph.D.  [facility] [address] [place], [place] [address] Phone: [phone] Fax:      [phone]

## 2024-08-02 ENCOUNTER — Telehealth: Payer: Self-pay

## 2024-08-02 ENCOUNTER — Other Ambulatory Visit (HOSPITAL_COMMUNITY): Payer: Self-pay

## 2024-08-02 NOTE — Telephone Encounter (Signed)
 Pharmacy Patient Advocate Encounter   Received notification from CoverMyMeds that prior authorization for Aimovig  is required/requested.   Insurance verification completed.   The patient is insured through [facility].   Per test claim: PA required; PA submitted to above mentioned insurance via Latent Key/confirmation #/EOC [id number] Status is pending

## 2024-08-07 ENCOUNTER — Other Ambulatory Visit (HOSPITAL_COMMUNITY): Payer: Self-pay

## 2024-08-07 NOTE — Telephone Encounter (Signed)
 Pharmacy Patient Advocate Encounter  Received notification from MED[employer] that Prior Authorization for Aimovig  has been APPROVED from 08/02/2024 to 01/29/2025. Ran test claim, Copay is $4.00. This test claim was processed through [facility]- copay amounts may vary at other pharmacies due to pharmacy/plan contracts, or as the patient moves through the different stages of their insurance plan.   PA #/Case ID/Reference #: [id number]

## 2024-08-13 ENCOUNTER — Encounter: Payer: Self-pay | Admitting: Family Medicine

## 2024-08-15 ENCOUNTER — Ambulatory Visit: Admitting: Family Medicine

## 2024-08-15 VITALS — BP 137/89 | HR 100 | Temp 97.9°F | Ht 64.0 in | Wt 218.6 lb

## 2024-08-15 DIAGNOSIS — N809 Endometriosis, unspecified: Secondary | ICD-10-CM

## 2024-08-15 DIAGNOSIS — F819 Developmental disorder of scholastic skills, unspecified: Secondary | ICD-10-CM

## 2024-08-15 DIAGNOSIS — K5909 Other constipation: Secondary | ICD-10-CM

## 2024-08-15 DIAGNOSIS — K219 Gastro-esophageal reflux disease without esophagitis: Secondary | ICD-10-CM | POA: Diagnosis not present

## 2024-08-15 DIAGNOSIS — F458 Other somatoform disorders: Secondary | ICD-10-CM

## 2024-08-15 NOTE — Progress Notes (Signed)
 Acute Office Visit  Subjective:     Patient ID: Claire Stewart, female    DOB: 2001/05/29, 23 y.o.   MRN: [id number]  Chief Complaint  Patient presents with   GI Problem    HPI  History of Present Illness   Claire Stewart is a 23 year old female with endometriosis who presents with abdominal symptoms and nausea.  She has been experiencing abdominal sensations described as 'something might be kicking' or 'rolling over and over again' for the past couple of months, primarily in the lower abdomen, occasionally higher. These sensations are most noticeable when lying down with her feet propped up, going to bed, or when she hasn't eaten. She also experiences increased frequency of urination, tiredness, blurry vision, heartburn, and nausea, particularly triggered by certain smells, such as her mother's cooking.  She has been constipated for at least two months, with bowel movements providing variable relief of abdominal pain. She uses fiber gummies and digestive advantage gummies for constipation but has not been using Miralax regularly. Her last bowel movement was the previous night, with a 'good one' occurring two days ago.  She experiences nausea without vomiting and has been using a liquid medication for nausea over the past few days. She also reports trouble swallowing and takes omeprazole  20 mg in the morning but is not taking it on an empty stomach due to her thyroid  medication schedule. She sometimes feels full quickly when eating, having only consumed an orange by mid-morning after waking early and falling back asleep.  She has a history of migraines, with a recent episode described as a 'head sensation' that improved with migraine medication, though it did not alleviate her abdominal symptoms. She notes that her head feels like a migraine but different from her usual migraines.  She has not had a menstrual period for approximately 293 days due to continuous birth control use for  endometriosis management. She has been on birth control for about two and a half to three years to manage endometriosis symptoms.   She is concerned that her symptoms are due to pregnancy. She has never been sexually active. She has also been taking her birth control as prescribed without missed doses. When discussing that pregnancy is impossible if she has never been sexually active, she states that she has asked God for a child and she believes in miracles. Her mother reports that she has also tried to explain this is Benetta but that Ashby has prayed to God for a child and believes that she may be pregnant.       ROS As per HPI.      Objective:    BP 137/89   Pulse 100   Temp 97.9 F (36.6 C) (Temporal)   Ht 5' 4 (1.626 m)   Wt 218 lb 9.6 oz (99.2 kg)   SpO2 99%   BMI 37.52 kg/m    Physical Exam Vitals and nursing note reviewed.  Constitutional:      General: She is not in acute distress.    Appearance: Normal appearance. She is not ill-appearing, toxic-appearing or diaphoretic.  Cardiovascular:     Rate and Rhythm: Normal rate and regular rhythm.     Pulses: Normal pulses.     Heart sounds: Normal heart sounds. No murmur heard. Pulmonary:     Effort: Pulmonary effort is normal. No respiratory distress.     Breath sounds: Normal breath sounds.  Abdominal:     General: Bowel sounds are normal. There  is no distension.     Palpations: Abdomen is soft.     Tenderness: There is no abdominal tenderness. There is no right CVA tenderness, left CVA tenderness, guarding or rebound.  Musculoskeletal:     Right lower leg: No edema.     Left lower leg: No edema.  Skin:    General: Skin is warm and dry.  Neurological:     General: No focal deficit present.     Mental Status: She is alert and oriented to person, place, and time.  Psychiatric:        Attention and Perception: Attention normal.        Speech: Speech normal.     No results found for any visits on  08/15/24.      Assessment & Plan:   Chariah was seen today for gi problem.  Diagnoses and all orders for this visit:  Gastroesophageal reflux disease, unspecified whether esophagitis present  Endometriosis  Chronic constipation  Learning disabilities  Delusion of pregnancy   Assessment and Plan    Gastroesophageal reflux disease GERD symptoms uncontrolled due to suboptimal omeprazole  timing. - Discussed that nausea may be due to uncontrolled GERD - Take omeprazole  at night, at least two hours after last meal - Monitor symptoms and report if no improvement in two weeks for dosage adjustment.  Chronic constipation - Take Miralax daily until stools are loose, then adjust frequency. - Discussed that sensations in abdomen may be gas, or peristalsis  Endometriosis Managed with continuous birth control to prevent symptoms. - Continue current birth control regimen. - Consult OBGYN when planning to conceive for management and medication adjustments.      Delusion of pregnancy - Education provided that she cannot be pregnant if she has never been sexually active - Discussed continuation of birth control.  - She plans to take a home pregnancy test - If delusion persists, will discuss referral to [facility].   Return to office for new or worsening symptoms, or if symptoms persist.   Claire CHRISTELLA Search, FNP

## 2024-08-15 NOTE — Patient Instructions (Addendum)
 Take miralax daily until loose stools the decrease to as needed.   Try taking omeprazole  at night at least 2 hours after you have eaten last.

## 2024-08-16 ENCOUNTER — Encounter: Payer: Self-pay | Admitting: Family Medicine

## 2024-08-16 ENCOUNTER — Encounter: Payer: Self-pay | Admitting: Neurology

## 2024-08-16 DIAGNOSIS — N809 Endometriosis, unspecified: Secondary | ICD-10-CM | POA: Insufficient documentation

## 2024-08-19 ENCOUNTER — Other Ambulatory Visit (HOSPITAL_COMMUNITY): Payer: Self-pay

## 2024-08-19 NOTE — Telephone Encounter (Signed)
 thanks

## 2024-08-19 NOTE — Telephone Encounter (Signed)
 Faxed over PA approval letter and gave details of the approval and paid test claim on my end-left my contact info to call me if they had any issues.

## 2024-08-20 NOTE — Telephone Encounter (Signed)
 Per [facility] this was a not a PA issue-they state they did not have the medication in stock and had to order it-they will have it ready for the patient today copay of $4.00

## 2024-08-30 ENCOUNTER — Ambulatory Visit: Payer: Self-pay

## 2024-08-30 NOTE — Telephone Encounter (Signed)
 Noted

## 2024-08-30 NOTE — Telephone Encounter (Signed)
 FYI Only or Action Required?: FYI only for provider: appointment scheduled on 09/02/2024.  Patient was last seen in primary care on 08/15/2024 by Joesph Annabella HERO, FNP.  Called Nurse Triage reporting Ankle Pain.  Symptoms began several days ago.  Interventions attempted: OTC medications: ibuprofen.  Symptoms are: stable.  Triage Disposition: See PCP When Office is Open (Within 3 Days)  Patient/caregiver understands and will follow disposition?: Yes  Copied from CRM #[id number]. Topic: Clinical - Red Word Triage >> Aug 30, 2024 12:13 PM Wess RAMAN wrote: Red Word that prompted transfer to Nurse Triage: Patient believes she has refractured her right ankle. Pain and swelling. Pain level 11 Reason for Disposition  [1] MODERATE pain (e.g., interferes with normal activities, limping) AND [2] present > 3 days  Answer Assessment - Initial Assessment Questions Tripped Monday on the stairs, kicked a step very hard and then ankle.  1. ONSET: When did the pain start?      Monday 2. LOCATION: Where is the pain located?      Right ankle 3. PAIN: How bad is the pain?  (Scale 1-10; or mild, moderate, severe)     11, ibuprofen and lidocane patches 5. CAUSE: What do you think is causing the ankle pain?     injury 6. OTHER SYMPTOMS: Do you have any other symptoms? (e.g., calf pain, rash, fever, swelling)     Swelling a little bit.  7. PREGNANCY: Is there any chance you are pregnant? When was your last menstrual period?     Denies  Protocols used: Ankle Pain-A-AH

## 2024-09-02 ENCOUNTER — Encounter: Payer: Self-pay | Admitting: Family Medicine

## 2024-09-02 ENCOUNTER — Ambulatory Visit: Admitting: Family Medicine

## 2024-09-02 ENCOUNTER — Ambulatory Visit

## 2024-09-02 VITALS — BP 108/74 | HR 82 | Temp 98.2°F | Ht 64.0 in | Wt 221.0 lb

## 2024-09-02 DIAGNOSIS — J101 Influenza due to other identified influenza virus with other respiratory manifestations: Secondary | ICD-10-CM

## 2024-09-02 DIAGNOSIS — M79671 Pain in right foot: Secondary | ICD-10-CM

## 2024-09-02 DIAGNOSIS — S99921A Unspecified injury of right foot, initial encounter: Secondary | ICD-10-CM

## 2024-09-02 NOTE — Progress Notes (Signed)
 "  Acute Office Visit  Subjective:     Patient ID: Claire Stewart, female    DOB: 07-21-2001, 23 y.o.   MRN: [id number]  Chief Complaint  Patient presents with   Ankle Pain    Right- injured one year ago ( rolled). Hit stairs yesterday and it is painful now.   Foot Pain    Ankle Pain   Foot Pain    History of Present Illness   Claire Stewart is a 23 year old female who presents with foot pain after an injury. She is accompanied by her mother.  Foot pain and injury - Acute onset of foot pain after striking foot on stairs on Monday due to misjudgment while walking up - Pain localized to the dorsal midfoot, radiating up the leg with prolonged ambulation - New bruising present near the toe - History of fracture in the same foot last year - Pain managed with Tylenol, lidocaine patches, and ace wrap for support - Orthopedic brace from previous injury not used - Elevates and rests foot when possible  Influenza symptoms - Tested positive for influenza yesterday - Experiencing fatigue and exhaustion with significantly reduced energy levels - Vomiting throughout the day - taking tamiflu and OTC medication    .   ROS As per HPI.      Objective:    There were no vitals taken for this visit.   Physical Exam Vitals and nursing note reviewed.  Constitutional:      General: She is not in acute distress.    Appearance: She is not toxic-appearing or diaphoretic.  Pulmonary:     Effort: Pulmonary effort is normal. No respiratory distress.  Musculoskeletal:     Right ankle: No swelling or ecchymosis. No tenderness. Normal range of motion.     Comments: Right foot: Minimal bruising and swelling to right dorsal mid foot. No bony tenderness. No decreased ROM.   Skin:    General: Skin is warm and dry.  Neurological:     Mental Status: She is alert and oriented to person, place, and time. Mental status is at baseline.  Psychiatric:        Mood and Affect: Mood normal.         Behavior: Behavior normal.     No results found for any visits on 09/02/24.      Assessment & Plan:   [name] was seen today for ankle pain and foot pain.  Diagnoses and all orders for this visit:  Injury of right foot, initial encounter -     DG Foot Complete Right; Future  Influenza A  Assessment and Plan    Right foot injury  Acute injury with pain to midfoot, exacerbated by movement and weight bearing. No fracture noted, but awaiting radiologist's read - Ordered stat x-ray of right foot.  - Advised wearing ASO brace for support. - Recommended rest and elevation of the foot. - Instructed to follow up with podiatrist if pain persists or worsens after a couple of weeks. - Will place referral if fracture is present.   Influenza Recent positive influenza test. Symptoms consistent with viral infection. - Advised rest, symptomatic care, and adequate sleep to aid recovery.      Return to office for new or worsening symptoms, or if symptoms persist.   The patient indicates understanding of these issues and agrees with the plan.  Total time spent caring for the patient today was 32 minutes. This includes time spent before the visit reviewing  the chart, time spent during the visit, and time spent after the visit on documentation.  Annabella CHRISTELLA Search, FNP   "

## 2024-09-04 ENCOUNTER — Ambulatory Visit: Payer: Self-pay | Admitting: Family Medicine

## 2024-09-24 ENCOUNTER — Other Ambulatory Visit: Payer: Self-pay | Admitting: *Deleted

## 2024-09-24 DIAGNOSIS — Z30011 Encounter for initial prescription of contraceptive pills: Secondary | ICD-10-CM

## 2024-09-24 MED ORDER — JUNEL FE 1/20 1-20 MG-MCG PO TABS: 1.0000 | ORAL_TABLET | Freq: Every day | ORAL | 0 refills | Status: AC

## 2024-10-05 DIAGNOSIS — K219 Gastro-esophageal reflux disease without esophagitis: Secondary | ICD-10-CM

## 2024-11-21 ENCOUNTER — Encounter: Payer: Self-pay | Admitting: Family Medicine

## 2024-11-26 ENCOUNTER — Ambulatory Visit: Admitting: Neurology
